# Patient Record
Sex: Male | Born: 1975 | Race: White | Hispanic: No | Marital: Married | Smoking: Never smoker
Health system: Southern US, Community
[De-identification: ages and names within clinical notes are randomized; demographics above are authoritative.]

## PROBLEM LIST (undated history)

## (undated) DIAGNOSIS — R480 Dyslexia and alexia: Secondary | ICD-10-CM

## (undated) DIAGNOSIS — Z87442 Personal history of urinary calculi: Secondary | ICD-10-CM

## (undated) DIAGNOSIS — I10 Essential (primary) hypertension: Secondary | ICD-10-CM

## (undated) HISTORY — DX: Essential (primary) hypertension: I10

## (undated) HISTORY — PX: KNEE SURGERY: SHX244

## (undated) HISTORY — PX: RETROPERITONEAL LYMPH NODE EXCISION: SUR551

## (undated) HISTORY — PX: APPENDECTOMY: SHX54

## (undated) HISTORY — PX: SHOULDER SURGERY: SHX246

---

## 2009-10-23 ENCOUNTER — Ambulatory Visit: Payer: Self-pay | Admitting: Cardiology

## 2009-10-23 DIAGNOSIS — I10 Essential (primary) hypertension: Secondary | ICD-10-CM

## 2009-10-23 DIAGNOSIS — R9431 Abnormal electrocardiogram [ECG] [EKG]: Secondary | ICD-10-CM | POA: Insufficient documentation

## 2009-10-24 ENCOUNTER — Encounter: Payer: Self-pay | Admitting: Cardiology

## 2009-10-24 ENCOUNTER — Ambulatory Visit: Payer: Self-pay | Admitting: Cardiology

## 2009-10-24 ENCOUNTER — Ambulatory Visit (HOSPITAL_COMMUNITY): Admission: RE | Admit: 2009-10-24 | Discharge: 2009-10-24 | Payer: Self-pay | Admitting: Cardiology

## 2009-10-25 ENCOUNTER — Encounter: Payer: Self-pay | Admitting: Cardiology

## 2009-10-27 ENCOUNTER — Telehealth (INDEPENDENT_AMBULATORY_CARE_PROVIDER_SITE_OTHER): Payer: Self-pay

## 2011-12-25 ENCOUNTER — Encounter: Payer: Self-pay | Admitting: Cardiology

## 2013-11-25 ENCOUNTER — Other Ambulatory Visit: Payer: Self-pay | Admitting: Nurse Practitioner

## 2013-11-29 NOTE — Telephone Encounter (Signed)
Last seen 11/09/12.

## 2013-12-31 ENCOUNTER — Other Ambulatory Visit: Payer: Self-pay | Admitting: Nurse Practitioner

## 2014-01-04 NOTE — Telephone Encounter (Signed)
ntbs

## 2014-01-04 NOTE — Telephone Encounter (Signed)
Last seen 11/11/3 MMM

## 2014-01-24 ENCOUNTER — Encounter: Payer: Self-pay | Admitting: Nurse Practitioner

## 2014-01-26 ENCOUNTER — Encounter: Payer: Self-pay | Admitting: Nurse Practitioner

## 2014-02-01 ENCOUNTER — Other Ambulatory Visit: Payer: Self-pay | Admitting: Nurse Practitioner

## 2014-04-19 ENCOUNTER — Other Ambulatory Visit: Payer: Self-pay

## 2014-05-18 ENCOUNTER — Encounter: Payer: Self-pay | Admitting: *Deleted

## 2014-05-25 ENCOUNTER — Encounter: Payer: Self-pay | Admitting: Physician Assistant

## 2014-05-26 ENCOUNTER — Telehealth: Payer: Self-pay | Admitting: Physician Assistant

## 2014-05-26 ENCOUNTER — Encounter: Payer: Self-pay | Admitting: Physician Assistant

## 2014-05-26 MED ORDER — HYDROCHLOROTHIAZIDE 25 MG PO TABS: ORAL_TABLET | ORAL | Status: DC

## 2014-05-26 NOTE — Telephone Encounter (Signed)
They had moved his appt with bill and [place]. Can i please refill hctz til we can get him in. If you will send back to me I will let patient know and get him scheduled

## 2014-05-26 NOTE — Telephone Encounter (Signed)
rx sent to pharmacy

## 2014-05-26 NOTE — Telephone Encounter (Signed)
Patient aware and appt scheduled.

## 2014-06-07 ENCOUNTER — Encounter: Payer: Self-pay | Admitting: Physician Assistant

## 2014-06-07 ENCOUNTER — Ambulatory Visit (INDEPENDENT_AMBULATORY_CARE_PROVIDER_SITE_OTHER): Payer: BC Managed Care – PPO | Admitting: Physician Assistant

## 2014-06-07 VITALS — BP 136/84 | HR 95 | Temp 98.3°F | Ht 74.0 in | Wt 396.4 lb

## 2014-06-07 DIAGNOSIS — Z0000 Encounter for general adult medical examination without abnormal findings: Secondary | ICD-10-CM

## 2014-06-07 MED ORDER — HYDROCHLOROTHIAZIDE 25 MG PO TABS: ORAL_TABLET | ORAL | Status: DC

## 2014-06-07 NOTE — Addendum Note (Signed)
Addended by: Inis Sizer on: 06/07/2014 06:25 PM   Modules accepted: Orders

## 2014-06-07 NOTE — Progress Notes (Signed)
Subjective:     Patient ID: Tyler Wheeler, male   DOB: 1976/12/03, 38 y.o.   MRN: [id number]  HPI Pt here today for PE/ wellness visit He does have the complaints of rash to both hands Rash only occurs when he comes into contact with a fluid used at work Rash is pruritic Also has swelling to both legs with some darker color to the legs  Review of Systems  Constitutional: Negative.   HENT: Negative.   Eyes: Negative.   Respiratory: Negative.   Cardiovascular: Positive for leg swelling.  Gastrointestinal: Negative.   Endocrine: Negative.   Genitourinary: Negative.   Musculoskeletal: Negative.   Skin: Positive for color change and rash.  Allergic/Immunologic: Negative.   Neurological: Negative.   Hematological: Negative.   Psychiatric/Behavioral: Negative.        Objective:   Physical Exam  Vitals reviewed. Constitutional: He is oriented to person, place, and time. He appears well-developed and well-nourished.  HENT:  Head: Normocephalic and atraumatic.  Right Ear: External ear normal.  Left Ear: External ear normal.  Nose: Nose normal.  Mouth/Throat: Oropharynx is clear and moist.  Eyes: Conjunctivae and EOM are normal. Pupils are equal, round, and reactive to light.  Neck: Normal range of motion. Neck supple. No JVD present. No thyromegaly present.  Cardiovascular: Normal rate, regular rhythm, normal heart sounds and intact distal pulses.   Pulmonary/Chest: Effort normal and breath sounds normal. He has no wheezes.  Abdominal: Soft. Bowel sounds are normal. He exhibits no distension and no mass. There is no tenderness. There is no guarding.  Musculoskeletal: Normal range of motion. He exhibits edema.  Stasis derm to both of the lower ext  Lymphadenopathy:    He has no cervical adenopathy.  Neurological: He is alert and oriented to person, place, and time. He has normal reflexes.  Skin: Skin is warm. Rash noted.  Well demarc rash to the dorsum of both hands No  ulceration Sl scale  Psychiatric: He has a normal mood and affect. His behavior is normal. Judgment and thought content normal.       Assessment:     CPE    Plan:     Pt has had recent dental visit Pt needing eye exam Discussed with the pt a need for exercise program and eating healthy Informed that his weight and having to stand for a prolonged time is causing his lower ext edema Recommended moving if he can, good shoes, and OTC compression stockings Avoidance of the chemical that is causing the rash OTC hydrocort and lotion to the areas RF of his med done today Order for fasting labs done F/U ending labs

## 2014-06-07 NOTE — Patient Instructions (Signed)
Fat and Cholesterol Control Diet  Fat and cholesterol levels in your blood and organs are influenced by your diet. High levels of fat and cholesterol may lead to diseases of the heart, small and large blood vessels, gallbladder, liver, and pancreas.  CONTROLLING FAT AND CHOLESTEROL WITH DIET  Although exercise and lifestyle factors are important, your diet is key. That is because certain foods are known to raise cholesterol and others to lower it. The goal is to balance foods for their effect on cholesterol and more importantly, to replace saturated and trans fat with other types of fat, such as monounsaturated fat, polyunsaturated fat, and omega-3 fatty acids.  On average, a person should consume no more than 15 to 17 g of saturated fat daily. Saturated and trans fats are considered "bad" fats, and they will raise LDL cholesterol. Saturated fats are primarily found in animal products such as meats, butter, and cream. However, that does not mean you need to give up all your favorite foods. Today, there are good tasting, low-fat, low-cholesterol substitutes for most of the things you like to eat. Choose low-fat or nonfat alternatives. Choose round or loin cuts of red meat. These types of cuts are lowest in fat and cholesterol. Chicken (without the skin), fish, veal, and ground turkey breast are great choices. Eliminate fatty meats, such as hot dogs and salami. Even shellfish have little or no saturated fat. Have a 3 oz (85 g) portion when you eat lean meat, poultry, or fish.  Trans fats are also called "partially hydrogenated oils." They are oils that have been scientifically manipulated so that they are solid at room temperature resulting in a longer shelf life and improved taste and texture of foods in which they are added. Trans fats are found in stick margarine, some tub margarines, cookies, crackers, and baked goods.   When baking and cooking, oils are a great substitute for butter. The monounsaturated oils are  especially beneficial since it is believed they lower LDL and raise HDL. The oils you should avoid entirely are saturated tropical oils, such as coconut and palm.   Remember to eat a lot from food groups that are naturally free of saturated and trans fat, including fish, fruit, vegetables, beans, grains (barley, rice, couscous, bulgur wheat), and pasta (without cream sauces).   IDENTIFYING FOODS THAT LOWER FAT AND CHOLESTEROL   Soluble fiber may lower your cholesterol. This type of fiber is found in fruits such as apples, vegetables such as broccoli, potatoes, and carrots, legumes such as beans, peas, and lentils, and grains such as barley. Foods fortified with plant sterols (phytosterol) may also lower cholesterol. You should eat at least 2 g per day of these foods for a cholesterol lowering effect.   Read package labels to identify low-saturated fats, trans fat free, and low-fat foods at the supermarket. Select cheeses that have only 2 to 3 g saturated fat per ounce. Use a heart-healthy tub margarine that is free of trans fats or partially hydrogenated oil. When buying baked goods (cookies, crackers), avoid partially hydrogenated oils. Breads and muffins should be made from whole grains (whole-wheat or whole oat flour, instead of "flour" or "enriched flour"). Buy non-creamy canned soups with reduced salt and no added fats.   FOOD PREPARATION TECHNIQUES   Never deep-fry. If you must fry, either stir-fry, which uses very little fat, or use non-stick cooking sprays. When possible, broil, bake, or roast meats, and steam vegetables. Instead of putting butter or margarine on vegetables, use lemon   and herbs, applesauce, and cinnamon (for squash and sweet potatoes). Use nonfat yogurt, salsa, and low-fat dressings for salads.   LOW-SATURATED FAT / LOW-FAT FOOD SUBSTITUTES  Meats / Saturated Fat (g)  · Avoid: Steak, marbled (3 oz/85 g) / 11 g  · Choose: Steak, lean (3 oz/85 g) / 4 g  · Avoid: Hamburger (3 oz/85 g) / 7  g  · Choose: Hamburger, lean (3 oz/85 g) / 5 g  · Avoid: Ham (3 oz/85 g) / 6 g  · Choose: Ham, lean cut (3 oz/85 g) / 2.4 g  · Avoid: Chicken, with skin, dark meat (3 oz/85 g) / 4 g  · Choose: Chicken, skin removed, dark meat (3 oz/85 g) / 2 g  · Avoid: Chicken, with skin, light meat (3 oz/85 g) / 2.5 g  · Choose: Chicken, skin removed, light meat (3 oz/85 g) / 1 g  Dairy / Saturated Fat (g)  · Avoid: Whole milk (1 cup) / 5 g  · Choose: Low-fat milk, 2% (1 cup) / 3 g  · Choose: Low-fat milk, 1% (1 cup) / 1.5 g  · Choose: Skim milk (1 cup) / 0.3 g  · Avoid: Hard cheese (1 oz/28 g) / 6 g  · Choose: Skim milk cheese (1 oz/28 g) / 2 to 3 g  · Avoid: Cottage cheese, 4% fat (1 cup) / 6.5 g  · Choose: Low-fat cottage cheese, 1% fat (1 cup) / 1.5 g  · Avoid: Ice cream (1 cup) / 9 g  · Choose: Sherbet (1 cup) / 2.5 g  · Choose: Nonfat frozen yogurt (1 cup) / 0.3 g  · Choose: Frozen fruit bar / trace  · Avoid: Whipped cream (1 tbs) / 3.5 g  · Choose: Nondairy whipped topping (1 tbs) / 1 g  Condiments / Saturated Fat (g)  · Avoid: Mayonnaise (1 tbs) / 2 g  · Choose: Low-fat mayonnaise (1 tbs) / 1 g  · Avoid: Butter (1 tbs) / 7 g  · Choose: Extra light margarine (1 tbs) / 1 g  · Avoid: Coconut oil (1 tbs) / 11.8 g  · Choose: Olive oil (1 tbs) / 1.8 g  · Choose: Corn oil (1 tbs) / 1.7 g  · Choose: Safflower oil (1 tbs) / 1.2 g  · Choose: Sunflower oil (1 tbs) / 1.4 g  · Choose: Soybean oil (1 tbs) / 2.4 g  · Choose: Canola oil (1 tbs) / 1 g  Document Released: 12/16/2005 Document Revised: 04/12/2013 Document Reviewed: 06/06/2011  [employer]® Patient Information ©2014 [employer].

## 2014-06-30 ENCOUNTER — Other Ambulatory Visit: Payer: Self-pay | Admitting: Nurse Practitioner

## 2014-12-19 ENCOUNTER — Other Ambulatory Visit: Payer: Self-pay | Admitting: Family Medicine

## 2015-01-14 ENCOUNTER — Other Ambulatory Visit: Payer: Self-pay | Admitting: Family Medicine

## 2015-04-02 ENCOUNTER — Other Ambulatory Visit: Payer: Self-pay | Admitting: Family Medicine

## 2015-06-11 ENCOUNTER — Other Ambulatory Visit: Payer: Self-pay | Admitting: Physician Assistant

## 2015-10-13 ENCOUNTER — Encounter: Payer: Self-pay | Admitting: Family Medicine

## 2015-10-13 ENCOUNTER — Ambulatory Visit (INDEPENDENT_AMBULATORY_CARE_PROVIDER_SITE_OTHER): Payer: BLUE CROSS/BLUE SHIELD | Admitting: Family Medicine

## 2015-10-13 VITALS — BP 155/101 | HR 110 | Temp 100.3°F | Ht 74.0 in | Wt >= 6400 oz

## 2015-10-13 DIAGNOSIS — L309 Dermatitis, unspecified: Secondary | ICD-10-CM | POA: Diagnosis not present

## 2015-10-13 DIAGNOSIS — I10 Essential (primary) hypertension: Secondary | ICD-10-CM | POA: Diagnosis not present

## 2015-10-13 DIAGNOSIS — J3489 Other specified disorders of nose and nasal sinuses: Secondary | ICD-10-CM

## 2015-10-13 MED ORDER — LISINOPRIL-HYDROCHLOROTHIAZIDE 20-25 MG PO TABS: 1.0000 | ORAL_TABLET | Freq: Every day | ORAL | Status: DC

## 2015-10-13 NOTE — Progress Notes (Signed)
BP 155/101 mmHg  Pulse 110  Temp(Src) 100.3 F (37.9 C)  Ht  (1.88 m)  Wt 410 lb 9.6 oz (186.247 kg)  BMI 52.70 kg/m2   Subjective:    Patient ID: Tyler Wheeler, male    DOB: Aug 09, 1976, 39 y.o.   MRN: [id number]  HPI: Tyler Wheeler is a 39 y.o. male presenting on 10/13/2015 for Hypertension; Dry, peeling hands; and Cough   HPI Hypertension Patient's blood pressure significantly elevated today at 170/103 and 150/100. He has been on hydrochlorothiazide 25 mg and taking it daily. Patient denies headaches, blurred vision, chest pains, shortness of breath, or weakness. Denies any side effects from medication and is content with current medication. He is quite large and his obesity is probably related to his blood pressure.  Eczema He gets really dry skin and eczema on his hands. He recently went to an urgent care for this and was given a topical steroid cream. Which has been helping greatly. 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 He said it has improved on the cream  Sinus congestion Patient has sinus congestion and nasal symptoms consistent with allergies. He does admit to getting allergies this time here. He says the worst of it is at night and early morning when he gets up. And he is currently using his Flonase early in the morning to help combat this. It is helping some but he still has a lot of congestion when he wakes up. He denies fevers or chills or sore throat but does have cough that is dry and nonproductive, it is mostly just the nasal congestion and sinus pressure. He was ill 1 month ago but got better from that and didn't know if that was related or not.  Relevant past medical, surgical, family and social history reviewed and updated as indicated. Interim medical history since our last visit reviewed. Allergies and medications reviewed and updated.  Review of  Systems  Constitutional: Negative for fever and chills.  HENT: Positive for congestion, postnasal drip, sinus pressure and sneezing. Negative for ear discharge, ear pain, rhinorrhea, sore throat and voice change.   Eyes: Negative for pain, discharge, redness and visual disturbance.  Respiratory: Positive for cough. Negative for shortness of breath and wheezing.   Cardiovascular: Negative for chest pain and leg swelling.  Gastrointestinal: Negative for abdominal pain, diarrhea and constipation.  Genitourinary: Negative for difficulty urinating.  Musculoskeletal: Negative for back pain and gait problem.  Skin: Positive for rash.  Neurological: Negative for syncope, light-headedness and headaches.  All other systems reviewed and are negative.   Per HPI unless specifically indicated above     Medication List       This list is accurate as of: 10/13/15  4:35 PM.  Always use your most recent med list.               lisinopril-hydrochlorothiazide 20-25 MG tablet  Commonly known as:  PRINZIDE,ZESTORETIC  Take 1 tablet by mouth daily.           Objective:    BP 170/103 mmHg  Pulse 110  Temp(Src) 100.3 F (37.9 C)  Ht  (1.88 m)  Wt 410 lb 9.6 oz (186.247 kg)  BMI 52.70 kg/m2  Wt Readings from Last 3 Encounters:  10/13/15 410 lb 9.6 oz (186.247 kg)  06/07/14 396 lb 6.4 oz (179.806 kg)  10/23/09 406 lb (184.16 kg)    Physical Exam  Constitutional: He is oriented to person, place, and time. He appears well-developed and well-nourished. No distress.  HENT:  Right Ear: Tympanic membrane, external ear and ear canal normal.  Left Ear: Tympanic membrane, external ear and ear canal normal.  Nose: Mucosal edema present. No rhinorrhea. No epistaxis. Right sinus exhibits maxillary sinus tenderness. Right sinus exhibits no frontal sinus tenderness. Left sinus exhibits maxillary sinus tenderness. Left sinus exhibits no frontal sinus tenderness.  Mouth/Throat: Uvula is midline and  mucous membranes are normal. Posterior oropharyngeal edema present. No oropharyngeal exudate, posterior oropharyngeal erythema or tonsillar abscesses.  Eyes: Conjunctivae and EOM are normal. Pupils are equal, round, and reactive to light. Right eye exhibits no discharge. No scleral icterus.  Cardiovascular: Normal rate, regular rhythm, normal heart sounds and intact distal pulses.   No murmur heard. Pulmonary/Chest: Effort normal and breath sounds normal. No respiratory distress. He has no wheezes.  Abdominal: He exhibits no distension.  Musculoskeletal: Normal range of motion. He exhibits no edema.  Neurological: He is alert and oriented to person, place, and time. Coordination normal.  Skin: Skin is warm and dry. Rash (excoriation and cracking of the hands, very dry appearance.) noted. He is not diaphoretic.  Psychiatric: He has a normal mood and affect. His behavior is normal.  Vitals reviewed.   No results found for this or any previous visit.    Assessment & Plan:       Problem List Items Addressed This Visit      Cardiovascular and Mediastinum   Essential hypertension - Primary    Will get labs at work and he will send to me      Relevant Medications   lisinopril-hydrochlorothiazide (PRINZIDE,ZESTORETIC) 20-25 MG tablet     Musculoskeletal and Integument   Eczema    Has steroid cream, will do wet to dry gloves and no hot showers.       Other Visit Diagnoses    Sinus pressure        has flonase, take before bed        Follow up plan: Return in about 4 weeks (around 11/10/2015), or if symptoms worsen or fail to improve, for htn recheck.  Arville CareJoshua Dettinger, MD [facility] 10/13/2015, 4:35 PM

## 2015-10-13 NOTE — Assessment & Plan Note (Signed)
Will get labs at work and he will send to me

## 2015-10-13 NOTE — Assessment & Plan Note (Signed)
Has steroid cream, will do wet to dry gloves and no hot showers.

## 2015-11-17 ENCOUNTER — Ambulatory Visit: Payer: Self-pay | Admitting: Family Medicine

## 2015-11-20 ENCOUNTER — Encounter: Payer: Self-pay | Admitting: Family Medicine

## 2016-09-26 ENCOUNTER — Ambulatory Visit (INDEPENDENT_AMBULATORY_CARE_PROVIDER_SITE_OTHER): Payer: Worker's Compensation | Admitting: Family Medicine

## 2016-09-26 ENCOUNTER — Encounter: Payer: Self-pay | Admitting: Family Medicine

## 2016-09-26 VITALS — BP 110/75 | HR 89 | Temp 97.4°F | Ht 74.0 in | Wt >= 6400 oz

## 2016-09-26 DIAGNOSIS — S8391XA Sprain of unspecified site of right knee, initial encounter: Secondary | ICD-10-CM | POA: Diagnosis not present
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
## 2016-09-26 NOTE — Patient Instructions (Signed)
Great to meet you!  Today try to elevate your knee, apply ice for 15 minutes 4 or 5 times. Take 2 Aleve twice daily until we follow-up next Monday  Call or come back with any concerns

## 2016-09-30 ENCOUNTER — Ambulatory Visit (INDEPENDENT_AMBULATORY_CARE_PROVIDER_SITE_OTHER): Payer: Worker's Compensation | Admitting: Family Medicine

## 2016-09-30 ENCOUNTER — Encounter: Payer: Self-pay | Admitting: Family Medicine

## 2016-09-30 VITALS — BP 124/85 | HR 83 | Temp 97.2°F | Ht 74.0 in | Wt >= 6400 oz

## 2016-09-30 DIAGNOSIS — S8391XD Sprain of unspecified site of right knee, subsequent encounter: Secondary | ICD-10-CM | POA: Diagnosis not present

## 2016-09-30 DIAGNOSIS — S8991XD Unspecified injury of right lower leg, subsequent encounter: Secondary | ICD-10-CM

## 2016-09-30 NOTE — Patient Instructions (Signed)
Great to see you!  You will be called for an appointment to orthopedics
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
## 2016-10-01 ENCOUNTER — Other Ambulatory Visit: Payer: Self-pay | Admitting: Family Medicine

## 2016-10-01 DIAGNOSIS — I10 Essential (primary) hypertension: Secondary | ICD-10-CM

## 2016-12-13 ENCOUNTER — Other Ambulatory Visit: Payer: Self-pay | Admitting: Family Medicine

## 2016-12-13 DIAGNOSIS — I10 Essential (primary) hypertension: Secondary | ICD-10-CM

## 2017-03-28 ENCOUNTER — Other Ambulatory Visit: Payer: Self-pay | Admitting: Family Medicine

## 2017-03-28 DIAGNOSIS — I10 Essential (primary) hypertension: Secondary | ICD-10-CM

## 2017-09-04 ENCOUNTER — Encounter: Payer: Self-pay | Admitting: Family Medicine

## 2017-09-04 ENCOUNTER — Ambulatory Visit (INDEPENDENT_AMBULATORY_CARE_PROVIDER_SITE_OTHER): Payer: BLUE CROSS/BLUE SHIELD | Admitting: Family Medicine

## 2017-09-04 VITALS — BP 106/59 | HR 51 | Temp 98.6°F | Ht 74.0 in | Wt 388.0 lb

## 2017-09-04 DIAGNOSIS — Z0000 Encounter for general adult medical examination without abnormal findings: Secondary | ICD-10-CM | POA: Diagnosis not present

## 2017-09-04 DIAGNOSIS — M722 Plantar fascial fibromatosis: Secondary | ICD-10-CM

## 2017-09-04 DIAGNOSIS — I10 Essential (primary) hypertension: Secondary | ICD-10-CM

## 2017-09-04 DIAGNOSIS — Z13220 Encounter for screening for lipoid disorders: Secondary | ICD-10-CM

## 2017-09-04 DIAGNOSIS — K625 Hemorrhage of anus and rectum: Secondary | ICD-10-CM

## 2017-09-04 DIAGNOSIS — R5383 Other fatigue: Secondary | ICD-10-CM

## 2017-09-04 MED ORDER — LISINOPRIL-HYDROCHLOROTHIAZIDE 20-25 MG PO TABS: 1.0000 | ORAL_TABLET | Freq: Every day | ORAL | 1 refills | Status: DC

## 2017-09-04 NOTE — Progress Notes (Addendum)
BP (!) 106/59   Pulse (!) 51   Temp 98.6 F (37 C) (Oral)   Ht 6' 2"  (1.88 m)   Wt (!) 388 lb (176 kg)   BMI 49.82 kg/m    Subjective:    Patient ID: Tyler Wheeler, male    DOB: November 11, 1976, 41 y.o.   MRN: [id number]  HPI: Tyler Wheeler is a 41 y.o. male presenting on 09/04/2017 for his annual well adult exam. He is followed for hypertension. His blood pressure is running a little low today, and he takes 25 mg lisinopril/hctz without complaints.  He is feeling well today but does have a few medical complaints. He has been having pain in the sole of his right foot that radiates into his heel after prolonged standing on concrete floors. The pain is sharp. He has taken aleve without relief. It improves with rest.   He additionally reports BRP per rectum that leaves streaks in his underwear 1-2 times per week while sitting at home for the past year. He reports occasional spotting on the TP after defecation, but denies pain with defecation, melena, hematochezia. He additionally reports occasional light headedness and tunneled vision at work, particularly after standing from a seated position. His legs are swollen after standing all day at work. He also reports increased fatigue and daytime sleepiness. He sleeps 4-6 hours per night and his wife reports that he snores but does not stop breathing. He occasionally wakes up drenched in sweat. His grandfather has a history of hemorrhoids and his father has a history of sleep apnea.    HPI Relevant past medical, surgical, family and social history reviewed and updated as indicated. Interim medical history since our last visit reviewed. Allergies and medications reviewed and updated.  Review of Systems  Constitutional: Positive for fatigue. Negative for appetite change, chills, fever and unexpected weight change.  HENT: Negative for congestion, postnasal drip, rhinorrhea, sinus pain, sinus pressure and sore throat.   Respiratory: Negative for cough,  choking, shortness of breath and wheezing.   Cardiovascular: Negative for chest pain, palpitations and leg swelling.  Gastrointestinal: Positive for anal bleeding. Negative for abdominal pain, blood in stool, constipation, diarrhea, nausea, rectal pain and vomiting.  Genitourinary: Negative for difficulty urinating, dysuria, frequency, hematuria and urgency.  Musculoskeletal: Positive for arthralgias (pain in sole of right foot). Negative for back pain, joint swelling and myalgias.  Skin: Negative for color change, pallor, rash and wound.  Neurological: Positive for light-headedness. Negative for dizziness, weakness, numbness and headaches.  Psychiatric/Behavioral: Negative for agitation, behavioral problems and confusion.    Per HPI unless specifically indicated above     Objective:    BP (!) 106/59   Pulse (!) 51   Temp 98.6 F (37 C) (Oral)   Ht 6' 2"  (1.88 m)   Wt (!) 388 lb (176 kg)   BMI 49.82 kg/m   Wt Readings from Last 3 Encounters:  09/04/17 (!) 388 lb (176 kg)  09/30/16 (!) 410 lb 3.2 oz (186.1 kg)  09/26/16 (!) 400 lb 6.4 oz (181.6 kg)    Physical Exam  Constitutional: He is oriented to person, place, and time. He appears well-developed and well-nourished. No distress.  HENT:  Head: Normocephalic and atraumatic.  Nose: Nose normal.  Mouth/Throat: Oropharynx is clear and moist. No oropharyngeal exudate.  Mallampati I  Eyes: Pupils are equal, round, and reactive to light. Conjunctivae and EOM are normal. No scleral icterus.  Neck: Normal range of motion. Neck supple. No tracheal deviation  present. No thyromegaly present.  Cardiovascular: Normal rate, regular rhythm and normal heart sounds.   No murmur heard. Pulmonary/Chest: Effort normal and breath sounds normal. No respiratory distress. He has no wheezes. He has no rales.  Abdominal: Soft. Bowel sounds are normal. He exhibits no distension and no mass. There is no tenderness. There is no rebound and no guarding.    Genitourinary: Rectum normal and prostate normal. Rectal exam shows no external hemorrhoid, no internal hemorrhoid (not palpated, likely present), no fissure, no mass and anal tone normal.  Musculoskeletal: Normal range of motion. He exhibits edema (bilateral 2+ pitting edema in lower extremities ) and tenderness (TTP on right plantar fascia).  Lymphadenopathy:    He has no cervical adenopathy.  Neurological: He is alert and oriented to person, place, and time. He has normal reflexes.  Skin: Skin is warm and dry. No rash noted. He is not diaphoretic. No erythema.  Psychiatric: He has a normal mood and affect. His behavior is normal. Judgment and thought content normal.  Nursing note and vitals reviewed.     Assessment & Plan:   Problem List Items Addressed This Visit      Cardiovascular and Mediastinum   Essential hypertension   Relevant Medications   lisinopril-hydrochlorothiazide (PRINZIDE,ZESTORETIC) 20-25 MG tablet   Other Relevant Orders   CMP14+EGFR    Other Visit Diagnoses    Well adult exam    -  Primary   Plantar fasciitis       recommended ice and stretching   Bright red rectal bleeding       Relevant Orders   CBC with Differential/Platelet   Other fatigue       Relevant Orders   CBC with Differential/Platelet   TSH   Lipid screening       Relevant Orders   Lipid panel      Tyler Wheeler is a 41 y.o. male presenting on 09/04/2017 for his annual well adult exam.   Hypertension: His blood pressure was 106/59 today, and he takes 25 mg lisinopril/hctz. He does occasionally become light headed at work, particularly after standing. He has had some leg swelling so I will not discontinue the hctz component and instead reduce his dose by 50%.    Right Foot pain: He has TTP over his plantar fascia. This is plantar fasciitis. I counseled him to purchase arch support insoles for his shoes and continue to use Aleve. I also counseled him not to walk barefoot on his hard floors at  home, and instructed him to research plantar exercises to do daily.   Rectal bleeding: His rectal bleeding leaves streaks on his underwear and spots on the toilet paper. In an obese patient his age this is most likely hemorrhoids, he also has a history of hemmhoroids in his grandfather. I did visualize external or not palpate internal hemihidrosis on exam, but external hemorrhoids are still #1 on my differential given the painless bleeding. I instructed him to use a suppository with hemmhoroid cream at home, and to return if he has no improvement or begins having grossly bloody stools or pain with defecation.   Fatigue: He reports a history of daytime sleepiness and napping. He only sleeps 4-6 hours per night and has to wake up at 2-3 am for work, so this may well be chronic fatigue 2/2 poor sleep schedule. His wife reports snoring, but no waking up gasping for air. He does not wake up with headaches. His Mallampati score is I, but his  father has a history of sleep apnea and he is over weight. I counseled him to talk about a sleep study with his wife and we will discuss it at his next visit in 6 months. I will also check a CBC and TSH.      Follow up plan: Return if symptoms worsen or fail to improve.  Counseling provided for all of the vaccine components Orders Placed This Encounter  Procedures  . CBC with Differential/Platelet  . TSH  . CMP14+EGFR  . Lipid panel   Patient was seen and examined with Brock Bad medical student. Agree with assessment and plan above. We'll await labs and then see about possible sleep study in the future. Caryl Pina, MD [facility] 09/04/2017, 2:50 PM

## 2017-09-05 LAB — CMP14+EGFR
ALT: 20 IU/L (ref 0–44)
AST: 22 IU/L (ref 0–40)
Albumin/Globulin Ratio: 1.4 (ref 1.2–2.2)
Albumin: 4.3 g/dL (ref 3.5–5.5)
Alkaline Phosphatase: 63 IU/L (ref 39–117)
BUN/Creatinine Ratio: 12 (ref 9–20)
BUN: 11 mg/dL (ref 6–24)
Bilirubin Total: 0.3 mg/dL (ref 0.0–1.2)
CALCIUM: 8.9 mg/dL (ref 8.7–10.2)
CO2: 25 mmol/L (ref 20–29)
CREATININE: 0.95 mg/dL (ref 0.76–1.27)
Chloride: 99 mmol/L (ref 96–106)
GFR calc Af Amer: 115 mL/min/{1.73_m2} (ref >59)
GFR, EST NON AFRICAN AMERICAN: 100 mL/min/{1.73_m2} (ref >59)
GLUCOSE: 92 mg/dL (ref 65–99)
Globulin, Total: 3 g/dL (ref 1.5–4.5)
Potassium: 3.9 mmol/L (ref 3.5–5.2)
Sodium: 138 mmol/L (ref 134–144)
Total Protein: 7.3 g/dL (ref 6.0–8.5)

## 2017-09-05 LAB — CBC WITH DIFFERENTIAL/PLATELET
BASOS ABS: 0 10*3/uL (ref 0.0–0.2)
BASOS: 0 %
EOS (ABSOLUTE): 0.1 10*3/uL (ref 0.0–0.4)
Eos: 1 %
Hematocrit: 37.6 % (ref 37.5–51.0)
Hemoglobin: 12.1 g/dL — ABNORMAL LOW (ref 13.0–17.7)
IMMATURE GRANS (ABS): 0 10*3/uL (ref 0.0–0.1)
IMMATURE GRANULOCYTES: 0 %
LYMPHS: 27 %
Lymphocytes Absolute: 2.5 10*3/uL (ref 0.7–3.1)
MCH: 28.2 pg (ref 26.6–33.0)
MCHC: 32.2 g/dL (ref 31.5–35.7)
MCV: 88 fL (ref 79–97)
Monocytes Absolute: 0.6 10*3/uL (ref 0.1–0.9)
Monocytes: 6 %
NEUTROS PCT: 66 %
Neutrophils Absolute: 6.1 10*3/uL (ref 1.4–7.0)
PLATELETS: 253 10*3/uL (ref 150–379)
RBC: 4.29 x10E6/uL (ref 4.14–5.80)
RDW: 14.7 % (ref 12.3–15.4)
WBC: 9.3 10*3/uL (ref 3.4–10.8)

## 2017-09-05 LAB — LIPID PANEL
CHOL/HDL RATIO: 3.6 ratio (ref 0.0–5.0)
CHOLESTEROL TOTAL: 127 mg/dL (ref 100–199)
HDL: 35 mg/dL — AB (ref >39)
LDL CALC: 77 mg/dL (ref 0–99)
Triglycerides: 77 mg/dL (ref 0–149)
VLDL Cholesterol Cal: 15 mg/dL (ref 5–40)

## 2017-09-05 LAB — TSH: TSH: 1.25 u[IU]/mL (ref 0.450–4.500)

## 2017-09-07 ENCOUNTER — Other Ambulatory Visit: Payer: Self-pay | Admitting: Family Medicine

## 2017-09-07 DIAGNOSIS — I10 Essential (primary) hypertension: Secondary | ICD-10-CM

## 2017-09-08 ENCOUNTER — Other Ambulatory Visit: Payer: Self-pay | Admitting: Family Medicine

## 2017-09-08 DIAGNOSIS — I10 Essential (primary) hypertension: Secondary | ICD-10-CM

## 2018-03-18 ENCOUNTER — Other Ambulatory Visit: Payer: Self-pay | Admitting: Family Medicine

## 2018-03-18 DIAGNOSIS — I10 Essential (primary) hypertension: Secondary | ICD-10-CM

## 2018-03-18 NOTE — Telephone Encounter (Signed)
Last seen 09/04/17  Dr D

## 2018-06-23 ENCOUNTER — Other Ambulatory Visit: Payer: Self-pay | Admitting: Family Medicine

## 2018-06-23 DIAGNOSIS — I10 Essential (primary) hypertension: Secondary | ICD-10-CM

## 2018-06-23 NOTE — Telephone Encounter (Signed)
Last seen 09/04/17

## 2018-07-21 ENCOUNTER — Other Ambulatory Visit: Payer: Self-pay | Admitting: Family Medicine

## 2018-07-21 DIAGNOSIS — I10 Essential (primary) hypertension: Secondary | ICD-10-CM

## 2019-12-17 ENCOUNTER — Encounter: Payer: BLUE CROSS/BLUE SHIELD | Admitting: Family Medicine

## 2020-02-10 ENCOUNTER — Other Ambulatory Visit: Payer: Self-pay

## 2020-02-11 ENCOUNTER — Other Ambulatory Visit: Payer: Self-pay

## 2020-02-11 ENCOUNTER — Ambulatory Visit (INDEPENDENT_AMBULATORY_CARE_PROVIDER_SITE_OTHER): Payer: BLUE CROSS/BLUE SHIELD | Admitting: Family Medicine

## 2020-02-11 ENCOUNTER — Encounter: Payer: Self-pay | Admitting: Family Medicine

## 2020-02-11 VITALS — BP 135/86 | HR 85 | Temp 99.5°F | Ht 74.0 in | Wt >= 6400 oz

## 2020-02-11 DIAGNOSIS — I10 Essential (primary) hypertension: Secondary | ICD-10-CM

## 2020-02-11 DIAGNOSIS — Z0000 Encounter for general adult medical examination without abnormal findings: Secondary | ICD-10-CM | POA: Diagnosis not present

## 2020-02-11 DIAGNOSIS — H109 Unspecified conjunctivitis: Secondary | ICD-10-CM | POA: Insufficient documentation

## 2020-02-11 DIAGNOSIS — J019 Acute sinusitis, unspecified: Secondary | ICD-10-CM | POA: Insufficient documentation

## 2020-02-11 DIAGNOSIS — L299 Pruritus, unspecified: Secondary | ICD-10-CM | POA: Insufficient documentation

## 2020-02-11 DIAGNOSIS — J189 Pneumonia, unspecified organism: Secondary | ICD-10-CM | POA: Insufficient documentation

## 2020-02-11 MED ORDER — LISINOPRIL-HYDROCHLOROTHIAZIDE 20-25 MG PO TABS: 0.5000 | ORAL_TABLET | Freq: Every day | ORAL | 3 refills | Status: DC

## 2020-02-11 NOTE — Progress Notes (Signed)
BP 135/86   Pulse 85   Temp 99.5 F (37.5 C) (Temporal)   Ht 6' 2"  (1.88 m)   Wt (!) 404 lb 12.8 oz (183.6 kg)   SpO2 99%   BMI 51.97 kg/m    Subjective:   Patient ID: Tyler Wheeler, male    DOB: [birth year], 44 y.o.   MRN: [id number]  HPI: Taytum Wheller is a 44 y.o. male presenting on 02/11/2020 for Annual Exam and Leg Swelling (bilateral- states it has been on and off)   HPI Adult well exam Patient comes in for adult well exam and physical, he says he can get some leg swelling on and off especially when he is on his feet a lot for work.  He was taking the hydrochlorothiazide but got worse when he ran out of his medication.  The swelling is not severe but it does come a little bit off and on.  Patient denies any other health issues or concerns. Patient denies any chest pain, shortness of breath, headaches or vision issues, abdominal complaints, diarrhea, nausea, vomiting, or joint issues.   Hypertension Patient is currently on lisinopril hydrochlorothiazide, and their blood pressure today is 135/86. Patient denies any lightheadedness or dizziness. Patient denies headaches, blurred vision, chest pains, shortness of breath, or weakness. Denies any side effects from medication and is content with current medication.   Relevant past medical, surgical, family and social history reviewed and updated as indicated. Interim medical history since our last visit reviewed. Allergies and medications reviewed and updated.  Review of Systems  Constitutional: Negative for chills and fever.  HENT: Negative for ear pain and tinnitus.   Eyes: Negative for pain and discharge.  Respiratory: Negative for cough, shortness of breath and wheezing.   Cardiovascular: Negative for chest pain, palpitations and leg swelling (Trace bilateral lower extremity).  Gastrointestinal: Negative for abdominal pain, blood in stool, constipation and diarrhea.  Genitourinary: Negative for dysuria and hematuria.    Musculoskeletal: Negative for back pain, gait problem and myalgias.  Skin: Negative for rash.  Neurological: Negative for dizziness, weakness and headaches.  Psychiatric/Behavioral: Negative for suicidal ideas.  All other systems reviewed and are negative.   Per HPI unless specifically indicated above   Allergies as of 02/11/2020      Reactions   No Known Allergies       Medication List       Accurate as of February 11, 2020  3:36 PM. If you have any questions, ask your nurse or doctor.        lisinopril-hydrochlorothiazide 20-25 MG tablet Commonly known as: ZESTORETIC Take 0.5 tablets by mouth daily. What changed: how much to take Changed by: Fransisca Kaufmann Darrio Bade, MD        Objective:   BP 135/86   Pulse 85   Temp 99.5 F (37.5 C) (Temporal)   Ht 6' 2"  (1.88 m)   Wt (!) 404 lb 12.8 oz (183.6 kg)   SpO2 99%   BMI 51.97 kg/m   Wt Readings from Last 3 Encounters:  02/11/20 (!) 404 lb 12.8 oz (183.6 kg)  09/04/17 (!) 388 lb (176 kg)  09/30/16 (!) 410 lb 3.2 oz (186.1 kg)    Physical Exam Vitals and nursing note reviewed.  Constitutional:      General: He is not in acute distress.    Appearance: He is well-developed. He is not diaphoretic.  HENT:     Right Ear: External ear normal.     Left Ear: External ear  normal.     Nose: Nose normal.     Mouth/Throat:     Pharynx: No oropharyngeal exudate.  Eyes:     General: No scleral icterus.       Right eye: No discharge.     Conjunctiva/sclera: Conjunctivae normal.     Pupils: Pupils are equal, round, and reactive to light.  Neck:     Thyroid: No thyromegaly.  Cardiovascular:     Rate and Rhythm: Normal rate and regular rhythm.     Heart sounds: Normal heart sounds. No murmur.  Pulmonary:     Effort: Pulmonary effort is normal. No respiratory distress.     Breath sounds: Normal breath sounds. No wheezing.  Abdominal:     General: Bowel sounds are normal. There is no distension.     Palpations: Abdomen is  soft.     Tenderness: There is no abdominal tenderness. There is no guarding or rebound.  Musculoskeletal:        General: Normal range of motion.     Cervical back: Neck supple.  Lymphadenopathy:     Cervical: No cervical adenopathy.  Skin:    General: Skin is warm and dry.     Findings: No rash.  Neurological:     Mental Status: He is alert and oriented to person, place, and time.     Coordination: Coordination normal.  Psychiatric:        Behavior: Behavior normal.       Assessment & Plan:   Problem List Items Addressed This Visit      Cardiovascular and Mediastinum   Essential hypertension   Relevant Medications   lisinopril-hydrochlorothiazide (ZESTORETIC) 20-25 MG tablet   Other Relevant Orders   CBC with Differential/Platelet (Completed)   CMP14+EGFR (Completed)   Lipid panel (Completed)     Other   MORBID OBESITY   Relevant Orders   TSH (Completed)    Other Visit Diagnoses    Well adult exam    -  Primary   Relevant Orders   CBC with Differential/Platelet (Completed)   CMP14+EGFR (Completed)   Lipid panel (Completed)   TSH (Completed)      Continue lisinopril hydrochlorothiazide at half dose No other change medication Follow up plan: Return in about 1 year (around 02/10/2021), or if symptoms worsen or fail to improve, for Well examine hypertension.  Counseling provided for all of the vaccine components Orders Placed This Encounter  Procedures  . CBC with Differential/Platelet  . CMP14+EGFR  . Lipid panel  . TSH    Caryl Pina, MD [facility] 02/11/2020, 3:36 PM

## 2020-02-12 LAB — CMP14+EGFR
ALT: 21 IU/L (ref 0–44)
AST: 21 IU/L (ref 0–40)
Albumin/Globulin Ratio: 1.2 (ref 1.2–2.2)
Albumin: 4.1 g/dL (ref 4.0–5.0)
Alkaline Phosphatase: 69 IU/L (ref 39–117)
BUN/Creatinine Ratio: 11 (ref 9–20)
BUN: 10 mg/dL (ref 6–24)
Bilirubin Total: 0.2 mg/dL (ref 0.0–1.2)
CO2: 25 mmol/L (ref 20–29)
Calcium: 8.9 mg/dL (ref 8.7–10.2)
Chloride: 103 mmol/L (ref 96–106)
Creatinine, Ser: 0.91 mg/dL (ref 0.76–1.27)
GFR calc Af Amer: 120 mL/min/{1.73_m2} (ref >59)
GFR calc non Af Amer: 104 mL/min/{1.73_m2} (ref >59)
Globulin, Total: 3.3 g/dL (ref 1.5–4.5)
Glucose: 94 mg/dL (ref 65–99)
Potassium: 4 mmol/L (ref 3.5–5.2)
Sodium: 139 mmol/L (ref 134–144)
Total Protein: 7.4 g/dL (ref 6.0–8.5)

## 2020-02-12 LAB — CBC WITH DIFFERENTIAL/PLATELET
Basophils Absolute: 0.1 10*3/uL (ref 0.0–0.2)
Basos: 1 %
EOS (ABSOLUTE): 0.2 10*3/uL (ref 0.0–0.4)
Eos: 2 %
Hematocrit: 37.5 % (ref 37.5–51.0)
Hemoglobin: 12.4 g/dL — ABNORMAL LOW (ref 13.0–17.7)
Immature Grans (Abs): 0 10*3/uL (ref 0.0–0.1)
Immature Granulocytes: 0 %
Lymphocytes Absolute: 2.5 10*3/uL (ref 0.7–3.1)
Lymphs: 30 %
MCH: 27.5 pg (ref 26.6–33.0)
MCHC: 33.1 g/dL (ref 31.5–35.7)
MCV: 83 fL (ref 79–97)
Monocytes Absolute: 0.5 10*3/uL (ref 0.1–0.9)
Monocytes: 7 %
Neutrophils Absolute: 5 10*3/uL (ref 1.4–7.0)
Neutrophils: 60 %
Platelets: 239 10*3/uL (ref 150–450)
RBC: 4.51 x10E6/uL (ref 4.14–5.80)
RDW: 13.1 % (ref 11.6–15.4)
WBC: 8.3 10*3/uL (ref 3.4–10.8)

## 2020-02-12 LAB — LIPID PANEL
Chol/HDL Ratio: 3.2 ratio (ref 0.0–5.0)
Cholesterol, Total: 124 mg/dL (ref 100–199)
HDL: 39 mg/dL — ABNORMAL LOW (ref >39)
LDL Chol Calc (NIH): 72 mg/dL (ref 0–99)
Triglycerides: 63 mg/dL (ref 0–149)
VLDL Cholesterol Cal: 13 mg/dL (ref 5–40)

## 2020-02-12 LAB — TSH: TSH: 1.5 u[IU]/mL (ref 0.450–4.500)

## 2020-02-14 ENCOUNTER — Telehealth: Payer: Self-pay | Admitting: Family Medicine

## 2020-02-14 NOTE — Telephone Encounter (Signed)
Noted that patient will call back with fax number.

## 2020-02-14 NOTE — Telephone Encounter (Signed)
Pt returned missed call from [place]. Went over lab results with patient per Dr Oris Drone note. Pt said when he goes to work tomorrow, he will the get the fax# and call us back with it.

## 2020-02-21 NOTE — Telephone Encounter (Signed)
Pt called to give Dr Dettinger the Fax# to send his Physical form to. Fax# is [phone].

## 2020-02-21 NOTE — Telephone Encounter (Signed)
Paper faxed.

## 2020-02-21 NOTE — Telephone Encounter (Signed)
Here is the fax number for that physical form that he gave [place]

## 2021-02-12 ENCOUNTER — Other Ambulatory Visit: Payer: Self-pay

## 2021-02-12 ENCOUNTER — Encounter: Payer: Self-pay | Admitting: Family Medicine

## 2021-02-12 ENCOUNTER — Ambulatory Visit (INDEPENDENT_AMBULATORY_CARE_PROVIDER_SITE_OTHER): Payer: BC Managed Care – PPO | Admitting: Family Medicine

## 2021-02-12 VITALS — BP 128/83 | HR 109 | Ht 74.0 in | Wt >= 6400 oz

## 2021-02-12 DIAGNOSIS — I10 Essential (primary) hypertension: Secondary | ICD-10-CM

## 2021-02-12 DIAGNOSIS — Z0001 Encounter for general adult medical examination with abnormal findings: Secondary | ICD-10-CM | POA: Diagnosis not present

## 2021-02-12 DIAGNOSIS — E8881 Metabolic syndrome: Secondary | ICD-10-CM

## 2021-02-12 DIAGNOSIS — Z0000 Encounter for general adult medical examination without abnormal findings: Secondary | ICD-10-CM

## 2021-02-12 DIAGNOSIS — E669 Obesity, unspecified: Secondary | ICD-10-CM

## 2021-02-12 MED ORDER — LISINOPRIL-HYDROCHLOROTHIAZIDE 20-25 MG PO TABS: 0.5000 | ORAL_TABLET | Freq: Every day | ORAL | 3 refills | Status: DC

## 2021-02-12 NOTE — Progress Notes (Signed)
BP 128/83   Pulse (!) 109   Ht 6' 2"  (1.88 m)   Wt (!) 418 lb (189.6 kg)   SpO2 98%   BMI 53.67 kg/m    Subjective:   Patient ID: Tyler Wheeler, male    DOB: 24-Jan-1976, 45 y.o.   MRN: [id number]  HPI: Tyler Wheeler is a 45 y.o. male presenting on 02/12/2021 for Medical Management of Chronic Issues (CPE)   HPI Well adult exam and recheck of chronic medical issues Patient denies any chest pain, shortness of breath, headaches or vision issues, abdominal complaints, diarrhea, nausea, vomiting, or joint issues.  Hypertension Patient is currently on lisinopril hydrochlorothiazide, and their blood pressure today is 128/83. Patient denies any lightheadedness or dizziness. Patient denies headaches, blurred vision, chest pains, shortness of breath, or weakness. Denies any side effects from medication and is content with current medication.   Patient's hypertension are more complicated by the patient's morbid obesity.  Discussed weight loss and lifestyle modification and exercise with the patient.  Patient has steadily gained weight over the past few years and is now up to a BMI of 53.  Relevant past medical, surgical, family and social history reviewed and updated as indicated. Interim medical history since our last visit reviewed. Allergies and medications reviewed and updated.  Review of Systems  Constitutional: Negative for chills and fever.  HENT: Negative for ear pain and tinnitus.   Eyes: Negative for pain.  Respiratory: Negative for cough, shortness of breath and wheezing.   Cardiovascular: Negative for chest pain, palpitations and leg swelling.  Gastrointestinal: Negative for abdominal pain, blood in stool, constipation and diarrhea.  Genitourinary: Negative for dysuria and hematuria.  Musculoskeletal: Negative for back pain and myalgias.  Skin: Negative for rash.  Neurological: Negative for dizziness, weakness and headaches.  Psychiatric/Behavioral: Negative for suicidal ideas.     Per HPI unless specifically indicated above   Allergies as of 02/12/2021      Reactions   No Known Allergies       Medication List       Accurate as of February 12, 2021  3:08 PM. If you have any questions, ask your nurse or doctor.        lisinopril-hydrochlorothiazide 20-25 MG tablet Commonly known as: ZESTORETIC Take 0.5 tablets by mouth daily.        Objective:   BP 128/83   Pulse (!) 109   Ht 6' 2"  (1.88 m)   Wt (!) 418 lb (189.6 kg)   SpO2 98%   BMI 53.67 kg/m   Wt Readings from Last 3 Encounters:  02/12/21 (!) 418 lb (189.6 kg)  02/11/20 (!) 404 lb 12.8 oz (183.6 kg)  09/04/17 (!) 388 lb (176 kg)    Physical Exam Vitals and nursing note reviewed.  Constitutional:      General: He is not in acute distress.    Appearance: He is well-developed and well-nourished. He is not diaphoretic.  HENT:     Right Ear: External ear normal.     Left Ear: External ear normal.     Nose: Nose normal.     Mouth/Throat:     Mouth: Oropharynx is clear and moist.     Pharynx: No oropharyngeal exudate.  Eyes:     General: No scleral icterus.    Extraocular Movements: EOM normal.     Conjunctiva/sclera: Conjunctivae normal.  Neck:     Thyroid: No thyromegaly.  Cardiovascular:     Rate and Rhythm: Normal rate and regular  rhythm.     Pulses: Intact distal pulses.     Heart sounds: Normal heart sounds. No murmur heard.   Pulmonary:     Effort: Pulmonary effort is normal. No respiratory distress.     Breath sounds: Normal breath sounds. No wheezing.  Abdominal:     General: Bowel sounds are normal. There is no distension.     Palpations: Abdomen is soft.     Tenderness: There is no abdominal tenderness. There is no guarding or rebound.  Musculoskeletal:        General: No edema. Normal range of motion.     Cervical back: Neck supple.  Lymphadenopathy:     Cervical: No cervical adenopathy.  Skin:    General: Skin is warm and dry.     Findings: No rash.   Neurological:     Mental Status: He is alert and oriented to person, place, and time.     Coordination: Coordination normal.  Psychiatric:        Mood and Affect: Mood and affect normal.        Behavior: Behavior normal.       Assessment & Plan:   Problem List Items Addressed This Visit      Cardiovascular and Mediastinum   Essential hypertension   Relevant Medications   lisinopril-hydrochlorothiazide (ZESTORETIC) 20-25 MG tablet   Other Relevant Orders   CMP14+EGFR     Other   MORBID OBESITY   Relevant Orders   Lipid panel   TSH    Other Visit Diagnoses    Well adult exam    -  Primary   Relevant Orders   CBC with Differential/Platelet   CMP14+EGFR   Lipid panel   TSH   Abdominal obesity and metabolic syndrome          Very large recent conference, discussed weight loss and preventing diabetes and other things are coming along.  Patient is agreeable will get back to exercising, we discussed possibility of medications in the future and he wants to hold off but we will discuss it again in the future if he has issues. Follow up plan: Return in about 1 year (around 02/12/2022), or if symptoms worsen or fail to improve, for Physical exam and hypertension.  Counseling provided for all of the vaccine components Orders Placed This Encounter  Procedures  . CBC with Differential/Platelet  . CMP14+EGFR  . Lipid panel  . TSH    Caryl Pina, MD [facility] 02/12/2021, 3:08 PM

## 2021-02-13 LAB — CBC WITH DIFFERENTIAL/PLATELET
Basophils Absolute: 0.1 10*3/uL (ref 0.0–0.2)
Basos: 1 %
EOS (ABSOLUTE): 0.2 10*3/uL (ref 0.0–0.4)
Eos: 2 %
Hematocrit: 38.4 % (ref 37.5–51.0)
Hemoglobin: 12.3 g/dL — ABNORMAL LOW (ref 13.0–17.7)
Immature Grans (Abs): 0 10*3/uL (ref 0.0–0.1)
Immature Granulocytes: 0 %
Lymphocytes Absolute: 2.4 10*3/uL (ref 0.7–3.1)
Lymphs: 27 %
MCH: 28.1 pg (ref 26.6–33.0)
MCHC: 32 g/dL (ref 31.5–35.7)
MCV: 88 fL (ref 79–97)
Monocytes Absolute: 0.5 10*3/uL (ref 0.1–0.9)
Monocytes: 6 %
Neutrophils Absolute: 5.9 10*3/uL (ref 1.4–7.0)
Neutrophils: 64 %
Platelets: 249 10*3/uL (ref 150–450)
RBC: 4.37 x10E6/uL (ref 4.14–5.80)
RDW: 13.6 % (ref 11.6–15.4)
WBC: 9.1 10*3/uL (ref 3.4–10.8)

## 2021-02-13 LAB — LIPID PANEL
Chol/HDL Ratio: 3.3 ratio (ref 0.0–5.0)
Cholesterol, Total: 126 mg/dL (ref 100–199)
HDL: 38 mg/dL — ABNORMAL LOW (ref >39)
LDL Chol Calc (NIH): 71 mg/dL (ref 0–99)
Triglycerides: 90 mg/dL (ref 0–149)
VLDL Cholesterol Cal: 17 mg/dL (ref 5–40)

## 2021-02-13 LAB — CMP14+EGFR
ALT: 19 IU/L (ref 0–44)
AST: 20 IU/L (ref 0–40)
Albumin/Globulin Ratio: 1.4 (ref 1.2–2.2)
Albumin: 4.1 g/dL (ref 4.0–5.0)
Alkaline Phosphatase: 67 IU/L (ref 44–121)
BUN/Creatinine Ratio: 8 — ABNORMAL LOW (ref 9–20)
BUN: 7 mg/dL (ref 6–24)
Bilirubin Total: 0.2 mg/dL (ref 0.0–1.2)
CO2: 23 mmol/L (ref 20–29)
Calcium: 8.9 mg/dL (ref 8.7–10.2)
Chloride: 101 mmol/L (ref 96–106)
Creatinine, Ser: 0.93 mg/dL (ref 0.76–1.27)
GFR calc Af Amer: 116 mL/min/{1.73_m2} (ref >59)
GFR calc non Af Amer: 100 mL/min/{1.73_m2} (ref >59)
Globulin, Total: 3 g/dL (ref 1.5–4.5)
Glucose: 131 mg/dL — ABNORMAL HIGH (ref 65–99)
Potassium: 3.9 mmol/L (ref 3.5–5.2)
Sodium: 137 mmol/L (ref 134–144)
Total Protein: 7.1 g/dL (ref 6.0–8.5)

## 2021-02-13 LAB — TSH: TSH: 1.33 u[IU]/mL (ref 0.450–4.500)

## 2021-02-16 ENCOUNTER — Telehealth: Payer: Self-pay | Admitting: Family Medicine

## 2021-02-16 LAB — HGB A1C W/O EAG: Hgb A1c MFr Bld: 6.2 % — ABNORMAL HIGH (ref 4.8–5.6)

## 2021-02-16 LAB — SPECIMEN STATUS REPORT

## 2021-02-16 NOTE — Telephone Encounter (Signed)
Pt would like to discuss labs - encounter was closed because they were reviewed yesterday.

## 2021-02-16 NOTE — Telephone Encounter (Signed)
Pt informed of lab results. Hgb A1C has been added. Pt aware that we will call with results.

## 2021-07-06 ENCOUNTER — Encounter (HOSPITAL_COMMUNITY): Payer: Self-pay | Admitting: Orthopedic Surgery

## 2021-07-06 NOTE — Progress Notes (Signed)
DUE TO COVID-19 ONLY ONE VISITOR IS ALLOWED TO COME WITH YOU AND STAY IN THE WAITING ROOM ONLY DURING PRE OP AND PROCEDURE DAY OF SURGERY.   PCP - Dr Ivin Booty Dettinger Cardiologist - n/a  Chest x-ray - n/a EKG - DOS 07/10/21 Stress Test - n/a ECHO - 10/24/09 Cardiac Cath - n/a  Sleep Study -  n/a CPAP - none  ERAS: Clear liquids til 11:30 am DOS.  Anesthesia review: Yes  STOP now taking any Aspirin (unless otherwise instructed by your surgeon), Aleve, Naproxen, Ibuprofen, Motrin, Advil, Goody's, BC's, all herbal medications, fish oil, and all vitamins.   Coronavirus Screening Covid test n/a - Ambulatory Surgery  Do you have any of the following symptoms:  Cough yes/no: No Fever (>100.59F)  yes/no: No Runny nose yes/no: No Sore throat yes/no: No Difficulty breathing/shortness of breath  yes/no: No  Have you traveled in the last 14 days and where? yes/no: No  Patient verbalized understanding of instructions that were given via phone.

## 2021-07-10 ENCOUNTER — Other Ambulatory Visit: Payer: Self-pay

## 2021-07-10 ENCOUNTER — Encounter (HOSPITAL_COMMUNITY): Payer: Self-pay | Admitting: Orthopedic Surgery

## 2021-07-10 ENCOUNTER — Encounter (HOSPITAL_COMMUNITY)
Admission: RE | Disposition: A | Discharge status: Home / Self Care | Payer: Self-pay | Source: Home / Self Care | Attending: Orthopedic Surgery

## 2021-07-10 ENCOUNTER — Ambulatory Visit (HOSPITAL_COMMUNITY)
Admission: RE | Admit: 2021-07-10 | Discharge: 2021-07-10 | Disposition: A | Discharge status: Home / Self Care | Attending: Orthopedic Surgery | Admitting: Orthopedic Surgery

## 2021-07-10 ENCOUNTER — Ambulatory Visit (HOSPITAL_COMMUNITY): Admitting: Physician Assistant

## 2021-07-10 DIAGNOSIS — Z79899 Other long term (current) drug therapy: Secondary | ICD-10-CM | POA: Insufficient documentation

## 2021-07-10 DIAGNOSIS — S83241A Other tear of medial meniscus, current injury, right knee, initial encounter: Secondary | ICD-10-CM | POA: Insufficient documentation

## 2021-07-10 DIAGNOSIS — M94261 Chondromalacia, right knee: Secondary | ICD-10-CM | POA: Insufficient documentation

## 2021-07-10 DIAGNOSIS — X58XXXA Exposure to other specified factors, initial encounter: Secondary | ICD-10-CM | POA: Diagnosis not present

## 2021-07-10 DIAGNOSIS — Z87891 Personal history of nicotine dependence: Secondary | ICD-10-CM | POA: Insufficient documentation

## 2021-07-10 HISTORY — DX: Personal history of urinary calculi: Z87.442

## 2021-07-10 HISTORY — DX: Dyslexia and alexia: R48.0

## 2021-07-10 HISTORY — PX: KNEE ARTHROSCOPY WITH MEDIAL MENISECTOMY: SHX5651

## 2021-07-10 LAB — BASIC METABOLIC PANEL
Anion gap: 9 (ref 5–15)
BUN: 8 mg/dL (ref 6–20)
CO2: 22 mmol/L (ref 22–32)
Calcium: 8.5 mg/dL — ABNORMAL LOW (ref 8.9–10.3)
Chloride: 107 mmol/L (ref 98–111)
Creatinine, Ser: 0.81 mg/dL (ref 0.61–1.24)
GFR, Estimated: >60 mL/min (ref >60)
Glucose, Bld: 79 mg/dL (ref 70–99)
Potassium: 3.9 mmol/L (ref 3.5–5.1)
Sodium: 138 mmol/L (ref 135–145)

## 2021-07-10 SURGERY — ARTHROSCOPY, KNEE, WITH MEDIAL MENISCECTOMY
Anesthesia: General | Site: Knee | Laterality: Right

## 2021-07-10 MED ORDER — FENTANYL CITRATE (PF) 250 MCG/5ML IJ SOLN
INTRAMUSCULAR | Status: DC | PRN
  Administered 2021-07-10 (×2): 100 ug via INTRAVENOUS
  Administered 2021-07-10: 50 ug via INTRAVENOUS

## 2021-07-10 MED ORDER — SODIUM CHLORIDE 0.9 % IR SOLN
Status: DC | PRN
  Administered 2021-07-10 (×2): 3000 mL

## 2021-07-10 MED ORDER — ACETAMINOPHEN 10 MG/ML IV SOLN: 1000.0000 mg | Freq: Once | INTRAVENOUS | Status: DC | PRN

## 2021-07-10 MED ORDER — MIDAZOLAM HCL 2 MG/2ML IJ SOLN
INTRAMUSCULAR | Status: AC
  Filled 2021-07-10: qty 2

## 2021-07-10 MED ORDER — FENTANYL CITRATE (PF) 250 MCG/5ML IJ SOLN
INTRAMUSCULAR | Status: AC
  Filled 2021-07-10: qty 5

## 2021-07-10 MED ORDER — CEFAZOLIN IN SODIUM CHLORIDE 3-0.9 GM/100ML-% IV SOLN
3.0000 g | INTRAVENOUS | Status: AC
  Administered 2021-07-10: 3 g via INTRAVENOUS
  Filled 2021-07-10: qty 100

## 2021-07-10 MED ORDER — HYDROCODONE-ACETAMINOPHEN 5-325 MG PO TABS: 2.0000 | ORAL_TABLET | Freq: Four times a day (QID) | ORAL | 0 refills | Status: DC | PRN

## 2021-07-10 MED ORDER — BUPIVACAINE-EPINEPHRINE (PF) 0.25% -1:200000 IJ SOLN
INTRAMUSCULAR | Status: AC
  Filled 2021-07-10: qty 30

## 2021-07-10 MED ORDER — ONDANSETRON 4 MG PO TBDP: 4.0000 mg | ORAL_TABLET | Freq: Three times a day (TID) | ORAL | 0 refills | Status: DC | PRN

## 2021-07-10 MED ORDER — KETOROLAC TROMETHAMINE 30 MG/ML IJ SOLN: 30.0000 mg | Freq: Once | INTRAMUSCULAR | Status: DC

## 2021-07-10 MED ORDER — DEXAMETHASONE SODIUM PHOSPHATE 10 MG/ML IJ SOLN
INTRAMUSCULAR | Status: DC | PRN
  Administered 2021-07-10: 10 mg via INTRAVENOUS

## 2021-07-10 MED ORDER — OXYCODONE HCL 5 MG/5ML PO SOLN: 5.0000 mg | Freq: Once | ORAL | Status: DC | PRN

## 2021-07-10 MED ORDER — CHLORHEXIDINE GLUCONATE 0.12 % MT SOLN: 15.0000 mL | Freq: Once | OROMUCOSAL | Status: AC

## 2021-07-10 MED ORDER — CHLORHEXIDINE GLUCONATE 0.12 % MT SOLN
OROMUCOSAL | Status: AC
  Administered 2021-07-10: 15 mL via OROMUCOSAL
  Filled 2021-07-10: qty 15

## 2021-07-10 MED ORDER — LACTATED RINGERS IV SOLN: INTRAVENOUS | Status: DC

## 2021-07-10 MED ORDER — PROMETHAZINE HCL 25 MG/ML IJ SOLN: 6.2500 mg | INTRAMUSCULAR | Status: DC | PRN

## 2021-07-10 MED ORDER — ONDANSETRON HCL 4 MG/2ML IJ SOLN
INTRAMUSCULAR | Status: DC | PRN
  Administered 2021-07-10: 4 mg via INTRAVENOUS

## 2021-07-10 MED ORDER — ORAL CARE MOUTH RINSE: 15.0000 mL | Freq: Once | OROMUCOSAL | Status: AC

## 2021-07-10 MED ORDER — LIDOCAINE 2% (20 MG/ML) 5 ML SYRINGE
INTRAMUSCULAR | Status: DC | PRN
  Administered 2021-07-10: 100 mg via INTRAVENOUS

## 2021-07-10 MED ORDER — ONDANSETRON HCL 4 MG/2ML IJ SOLN
INTRAMUSCULAR | Status: AC
  Filled 2021-07-10: qty 2

## 2021-07-10 MED ORDER — AMISULPRIDE (ANTIEMETIC) 5 MG/2ML IV SOLN: 10.0000 mg | Freq: Once | INTRAVENOUS | Status: DC | PRN

## 2021-07-10 MED ORDER — BUPIVACAINE-EPINEPHRINE 0.25% -1:200000 IJ SOLN
INTRAMUSCULAR | Status: DC | PRN
  Administered 2021-07-10: 15 mL

## 2021-07-10 MED ORDER — PROPOFOL 10 MG/ML IV BOLUS
INTRAVENOUS | Status: DC | PRN
  Administered 2021-07-10: 200 mg via INTRAVENOUS

## 2021-07-10 MED ORDER — OXYCODONE HCL 5 MG PO TABS: 5.0000 mg | ORAL_TABLET | Freq: Once | ORAL | Status: DC | PRN

## 2021-07-10 MED ORDER — DEXAMETHASONE SODIUM PHOSPHATE 10 MG/ML IJ SOLN
INTRAMUSCULAR | Status: AC
  Filled 2021-07-10: qty 1

## 2021-07-10 MED ORDER — DEXMEDETOMIDINE (PRECEDEX) IN NS 20 MCG/5ML (4 MCG/ML) IV SYRINGE
PREFILLED_SYRINGE | INTRAVENOUS | Status: DC | PRN
  Administered 2021-07-10: 12 ug via INTRAVENOUS

## 2021-07-10 MED ORDER — SUCCINYLCHOLINE CHLORIDE 200 MG/10ML IV SOSY
PREFILLED_SYRINGE | INTRAVENOUS | Status: DC | PRN
  Administered 2021-07-10: 200 mg via INTRAVENOUS

## 2021-07-10 MED ORDER — FENTANYL CITRATE (PF) 100 MCG/2ML IJ SOLN: 25.0000 ug | INTRAMUSCULAR | Status: DC | PRN

## 2021-07-10 MED ORDER — MIDAZOLAM HCL 5 MG/5ML IJ SOLN
INTRAMUSCULAR | Status: DC | PRN
  Administered 2021-07-10: 2 mg via INTRAVENOUS

## 2021-07-10 MED ORDER — METHOCARBAMOL 500 MG PO TABS: 500.0000 mg | ORAL_TABLET | Freq: Four times a day (QID) | ORAL | 1 refills | Status: DC | PRN

## 2021-07-10 SURGICAL SUPPLY — 31 items
BAG COUNTER SPONGE SURGICOUNT (BAG) ×2 IMPLANT
BLADE SHAVER TORPEDO 4X13 (MISCELLANEOUS) ×2 IMPLANT
BNDG ELASTIC 6X15 VLCR STRL LF (GAUZE/BANDAGES/DRESSINGS) ×2 IMPLANT
BNDG ELASTIC 6X5.8 VLCR STR LF (GAUZE/BANDAGES/DRESSINGS) ×2 IMPLANT
CLSR STERI-STRIP ANTIMIC 1/2X4 (GAUZE/BANDAGES/DRESSINGS) ×2 IMPLANT
DRAPE ARTHROSCOPY W/POUCH 114 (DRAPES) ×2 IMPLANT
DRAPE U-SHAPE 47X51 STRL (DRAPES) ×2 IMPLANT
DRSG PAD ABDOMINAL 8X10 ST (GAUZE/BANDAGES/DRESSINGS) ×2 IMPLANT
DRSG XEROFORM 1X8 (GAUZE/BANDAGES/DRESSINGS) ×2 IMPLANT
DURAPREP 26ML APPLICATOR (WOUND CARE) ×2 IMPLANT
GAUZE 4X4 16PLY ~~LOC~~+RFID DBL (SPONGE) ×2 IMPLANT
GAUZE SPONGE 4X4 12PLY STRL (GAUZE/BANDAGES/DRESSINGS) ×2 IMPLANT
GAUZE XEROFORM 1X8 LF (GAUZE/BANDAGES/DRESSINGS) ×2 IMPLANT
GLOVE SRG 8 PF TXTR STRL LF DI (GLOVE) ×2 IMPLANT
GLOVE SURG ENC MOIS LTX SZ7.5 (GLOVE) ×4 IMPLANT
GLOVE SURG UNDER POLY LF SZ8 (GLOVE) ×2
GOWN STRL REUS W/ TWL LRG LVL3 (GOWN DISPOSABLE) ×1 IMPLANT
GOWN STRL REUS W/ TWL XL LVL3 (GOWN DISPOSABLE) ×2 IMPLANT
GOWN STRL REUS W/TWL LRG LVL3 (GOWN DISPOSABLE) ×1
GOWN STRL REUS W/TWL XL LVL3 (GOWN DISPOSABLE) ×2
KIT BASIN OR (CUSTOM PROCEDURE TRAY) ×2 IMPLANT
MANIFOLD NEPTUNE II (INSTRUMENTS) ×2 IMPLANT
NEEDLE PRECISIONGLIDE 27X1.5 (NEEDLE) ×2 IMPLANT
PACK ARTHROSCOPY DSU (CUSTOM PROCEDURE TRAY) ×2 IMPLANT
PADDING CAST COTTON 6X4 STRL (CAST SUPPLIES) ×2 IMPLANT
SUT ETHILON 4 0 PS 2 18 (SUTURE) ×2 IMPLANT
SUT MNCRL AB 3-0 PS2 27 (SUTURE) ×2 IMPLANT
SYR CONTROL 10ML LL (SYRINGE) ×2 IMPLANT
TOWEL GREEN STERILE (TOWEL DISPOSABLE) ×2 IMPLANT
TUBING ARTHROSCOPY IRRIG 16FT (MISCELLANEOUS) ×2 IMPLANT
WRAP KNEE MAXI GEL POST OP (GAUZE/BANDAGES/DRESSINGS) ×2 IMPLANT

## 2021-07-10 NOTE — Discharge Instructions (Signed)

## 2021-07-10 NOTE — H&P (Signed)
[facility]  REQUESTING PHYSICIAN: Yolonda Kida, MD  PCP:  Dettinger, Elige Radon, MD  Chief Complaint: Right knee pain  HPI: Tyler Wheeler is a 45 y.o. male who complains of right knee pain and mechanical symptoms since an injury to the right knee.  He is here today for arthroscopic intervention with partial medial meniscectomy.  We have discussed this in the office in detail.  No new complaints today.  Past Medical History:  Diagnosis Date   Dyslexia    History of kidney stones    passed stones   Hypertension    Past Surgical History:  Procedure Laterality Date   APPENDECTOMY     KNEE SURGERY     Right   RETROPERITONEAL LYMPH NODE EXCISION     Right side   SHOULDER SURGERY     Right   Social History   Socioeconomic History   Marital status: Married    Spouse name: Not on file   Number of children: Not on file   Years of education: Not on file   Highest education level: Not on file  Occupational History   Occupation: [employer]  Tobacco Use   Smoking status: Never   Smokeless tobacco: Former    Types: Snuff    Quit date: 2009  Vaping Use   Vaping Use: Never used  Substance and Sexual Activity   Alcohol use: No   Drug use: No   Sexual activity: Not on file  Other Topics Concern   Not on file  Social History Narrative   Married   No regular exercise   Social Determinants of Health   Financial Resource Strain: Not on file  Food Insecurity: Not on file  Transportation Needs: Not on file  Physical Activity: Not on file  Stress: Not on file  Social Connections: Not on file   Family History  Problem Relation Age of Onset   Hypertension Father    Aortic aneurysm Paternal Grandmother    Allergies  Allergen Reactions   No Known Allergies    Prior to Admission medications   Medication Sig Start Date End Date Taking? Authorizing Provider  acetaminophen (TYLENOL) 500 MG tablet Take 500 mg by mouth every 6 (six) hours as needed for mild pain  or moderate pain.   Yes [provider]  ferrous sulfate 325 (65 FE) MG tablet Take 325 mg by mouth daily with breakfast.   Yes [provider]  ibuprofen (ADVIL) 600 MG tablet Take 600 mg by mouth every 8 (eight) hours as needed for moderate pain or mild pain. 05/23/21  Yes [provider]  lisinopril-hydrochlorothiazide (ZESTORETIC) 20-25 MG tablet Take 0.5 tablets by mouth daily. Patient taking differently: Take 0.5 tablets by mouth at bedtime. 02/12/21  Yes Dettinger, Elige Radon, MD  Multiple Vitamins-Minerals (MULTIVITAMIN WITH MINERALS) tablet Take 1 tablet by mouth daily.   Yes [provider]   No results found.  Positive ROS: All other systems have been reviewed and were otherwise negative with the exception of those mentioned in the HPI and as above.  Physical Exam: General: Alert, no acute distress Cardiovascular: No pedal edema Respiratory: No cyanosis, no use of accessory musculature GI: No organomegaly, abdomen is soft and non-tender Skin: No lesions in the area of chief complaint Neurologic: Sensation intact distally Psychiatric: Patient is competent for consent with normal mood and affect Lymphatic: No axillary or cervical lymphadenopathy  MUSCULOSKELETAL:  Right lower extremity is warm well perfused.  No open wounds  or lesions.  Neurovascularly intact.  Assessment: Right knee medial meniscus tear, acute.  Plan: Plan to proceed today with arthroscopic surgery on the right knee.  Plan for arthroscopic partial medial meniscectomy and chondroplasty as indicated.  We again discussed the risk of bleeding, infection, damage to surrounding nerves and vessels, stiffness, DVT, persistent pain, need for further surgery, and risk of anesthesia.  He has provided informed consent.    Yolonda Kida, MD Cell [phone]    07/10/2021 12:41 PM

## 2021-07-10 NOTE — Anesthesia Procedure Notes (Signed)
Procedure Name: Intubation Date/Time: 07/10/2021 2:44 PM Performed by: Rosiland Oz, CRNA Pre-anesthesia Checklist: Patient identified, Emergency Drugs available, Suction available, Patient being monitored and Timeout performed Patient Re-evaluated:Patient Re-evaluated prior to induction Oxygen Delivery Method: Circle system utilized Preoxygenation: Pre-oxygenation with 100% oxygen Induction Type: IV induction Ventilation: Mask ventilation without difficulty Laryngoscope Size: Miller and 3 Grade View: Grade I Tube type: Oral Tube size: 7.5 mm Number of attempts: 1 Airway Equipment and Method: Stylet Placement Confirmation: ETT inserted through vocal cords under direct vision, positive ETCO2 and breath sounds checked- equal and bilateral Secured at: 23 cm Tube secured with: Tape Dental Injury: Teeth and Oropharynx as per pre-operative assessment

## 2021-07-10 NOTE — Transfer of Care (Signed)
Immediate Anesthesia Transfer of Care Note  Patient: Tyler Wheeler  Procedure(s) Performed: KNEE ARTHROSCOPY WITH PARTIAL  MEDIAL MENISECTOMY (Right: Knee)  Patient Location: PACU  Anesthesia Type:General  Level of Consciousness: drowsy and patient cooperative  Airway & Oxygen Therapy: Patient Spontanous Breathing  Post-op Assessment: Report given to RN and Post -op Vital signs reviewed and stable  Post vital signs: Reviewed and stable  Last Vitals:  Vitals Value Taken Time  BP 156/86 07/10/21 1530  Temp    Pulse 90 07/10/21 1531  Resp 17 07/10/21 1531  SpO2 94 % 07/10/21 1531  Vitals shown include unvalidated device data.  Last Pain:  Vitals:   07/10/21 1250  TempSrc:   PainSc: 7       Patients Stated Pain Goal: 8 (07/10/21 1250)  Complications: No notable events documented.

## 2021-07-10 NOTE — Brief Op Note (Signed)
07/10/2021  3:17 PM  PATIENT:  Tyler Wheeler  45 y.o. 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  PROCEDURE:  Procedure(s) with comments: KNEE ARTHROSCOPY WITH PARTIAL  MEDIAL MENISECTOMY (Right) - Right knee arthroscopic chondroplasty of trochlea and patella  SURGEON:  Surgeon(s) and Role:    * Yolonda Kida, MD - Primary  PHYSICIAN ASSISTANT: Dion Saucier, PA-C  ANESTHESIA:   local and general  EBL:  5 cc  BLOOD ADMINISTERED:none  DRAINS: none   LOCAL MEDICATIONS USED:  NONE  SPECIMEN:  No Specimen  DISPOSITION OF SPECIMEN:  N/A  COUNTS:  YES  TOURNIQUET:  * No tourniquets in log *  DICTATION: .Note written in EPIC  PLAN OF CARE: Discharge to home after PACU  PATIENT DISPOSITION:  PACU - hemodynamically stable.   Delay start of Pharmacological VTE agent (>24hrs) due to surgical blood loss or risk of bleeding: not applicable

## 2021-07-10 NOTE — Progress Notes (Signed)
Orthopedic Tech Progress Note Patient Details:  Hyatt Capobianco 1976/05/02 [id number]  PACU RN called requesting a pair of CRUTCHES for patient.   Ortho Devices Type of Ortho Device: Crutches Ortho Device/Splint Interventions: Application, Adjustment   Post Interventions Patient Tolerated: Ambulated well, Well Instructions Provided: Poper ambulation with device, Care of device  Donald Pore 07/10/2021, 5:10 PM

## 2021-07-10 NOTE — Anesthesia Preprocedure Evaluation (Addendum)
Anesthesia Evaluation  Patient identified by MRN, date of birth, ID band Patient awake    Reviewed: Allergy & Precautions, NPO status , Patient's Chart, lab work & pertinent test results  Airway Mallampati: III  TM Distance: >3 FB Neck ROM: Full    Dental no notable dental hx.    Pulmonary neg pulmonary ROS,    Pulmonary exam normal breath sounds clear to auscultation       Cardiovascular hypertension, Pt. on medications Normal cardiovascular exam Rhythm:Regular Rate:Normal  ECG: NSR, rate 71   Neuro/Psych negative neurological ROS  negative psych ROS   GI/Hepatic negative GI ROS, Neg liver ROS,   Endo/Other  Morbid obesity (SUPER)  Renal/GU negative Renal ROS  negative genitourinary   Musculoskeletal negative musculoskeletal ROS (+)   Abdominal (+) + obese,   Peds negative pediatric ROS (+)  Hematology negative hematology ROS (+)   Anesthesia Other Findings right knee medial meniscus tear  Reproductive/Obstetrics negative OB ROS                           Anesthesia Physical Anesthesia Plan  ASA: 4  Anesthesia Plan: General   Post-op Pain Management:    Induction: Intravenous  PONV Risk Score and Plan: 2 and Ondansetron, Dexamethasone, Midazolam and Treatment may vary due to age or medical condition  Airway Management Planned: Oral ETT  Additional Equipment:   Intra-op Plan:   Post-operative Plan: Extubation in OR  Informed Consent: I have reviewed the patients History and Physical, chart, labs and discussed the procedure including the risks, benefits and alternatives for the proposed anesthesia with the patient or authorized representative who has indicated his/her understanding and acceptance.     Dental advisory given  Plan Discussed with: CRNA and Surgeon  Anesthesia Plan Comments:        Anesthesia Quick Evaluation

## 2021-07-10 NOTE — Op Note (Signed)
Surgery Date: 07/10/2021  Surgeon(s): Yolonda Kida, MD  Assistant:  Dion Saucier, PA-C  Assistant attestation: PA Mcclung was present for the entire procedure and participated in all portions of the case.  ANESTHESIA:  general  FLUIDS: Per anesthesia record.   ESTIMATED BLOOD LOSS: minimal  PREOPERATIVE DIAGNOSES:  1. Right knee medial meniscus tear   POSTOPERATIVE DIAGNOSES:  Right knee medial meniscus tear, acute Right knee grade IV chondromalacia of the trochlea and patella  PROCEDURES PERFORMED:  1. knee arthroscopy with arthroscopic partial medial meniscectomy 2. knee arthroscopy with arthroscopic chondroplasty trochlea and patella with mechanical debridement  DESCRIPTION OF PROCEDURE: Tyler Wheeler is a 45 y.o.-year-old male with right knee medial meniscus tear. Plans are to proceed with partial medial meniscectomy and diagnostic arthroscopy with debridement as indicated. Full discussion held regarding risks benefits alternatives and complications related surgical intervention. Conservative care options reviewed. All questions answered.  The patient was identified in the preoperative holding area and the operative extremity was marked. The patient was brought to the operating room and transferred to operating table in a supine position. Satisfactory general anesthesia was induced by anesthesiology.    Standard anterolateral, anteromedial arthroscopy portals were obtained. The anteromedial portal was obtained with a spinal needle for localization under direct visualization with subsequent diagnostic findings.   Anteromedial and anterolateral chambers: moderate synovitis. The synovitis was debrided with a 4.5 mm full radius shaver through both the anteromedial and lateral portals.   Suprapatellar pouch and gutters: no synovitis or debris. Patella chondral surface: Grade 4 Trochlear chondral surface: Grade 4 Patellofemoral tracking: midline Medial meniscus:  Undersurface flap tear horizontal in nature in the mid body and free edge horizontal tearing posterior horn.  Medial femoral condyle flexion bearing surface: Grade 0 Medial femoral condyle extension bearing surface: Grade 0 Medial tibial plateau: Grade 0 Anterior cruciate ligament:stable Posterior cruciate ligament:stable Lateral meniscus: Intact.   Lateral femoral condyle flexion bearing surface: Grade 0 Lateral femoral condyle extension bearing surface: Grade 0 Lateral tibial plateau: Grade 0  Arthroscopic chondroplasty was carried out with combination of meniscal biter and motorized shaver.  We contoured unstable flap tears to stable shoulders.  All loose chondral flaps were debrided.  Partial medial meniscectomy was carried out with combination of meniscal biter and motorized shaver.  Undersurface flap tear was removed en bloc.  We did use the meniscal biter to remove the horizontal free edge tearing from the posterior horn as well.  This was smoothed and tailored with the motorized shaver.  After completion of synovectomy, diagnostic exam, and debridements as described, all compartments were checked and no residual debris remained. Hemostasis was achieved with the cautery wand. The portals were approximated with buried monocryl. All excess fluid was expressed from the joint.  Xeroform sterile gauze dressings were applied followed by Ace bandage and ice pack.   DISPOSITION: The patient was awakened from general anesthetic, extubated, taken to the recovery room in medically stable condition, no apparent complications. The patient may be weightbearing as tolerated to the operative lower extremity.  Range of motion of right knee as tolerated.

## 2021-07-11 ENCOUNTER — Encounter (HOSPITAL_COMMUNITY): Payer: Self-pay | Admitting: Orthopedic Surgery

## 2021-07-11 NOTE — Anesthesia Postprocedure Evaluation (Signed)
Anesthesia Post Note  Patient: Tyler Wheeler  Procedure(s) Performed: KNEE ARTHROSCOPY WITH PARTIAL  MEDIAL MENISECTOMY (Right: Knee)     Patient location during evaluation: PACU Anesthesia Type: General Level of consciousness: awake and alert Pain management: pain level controlled Vital Signs Assessment: post-procedure vital signs reviewed and stable Respiratory status: spontaneous breathing, nonlabored ventilation, respiratory function stable and patient connected to nasal cannula oxygen Cardiovascular status: blood pressure returned to baseline and stable Postop Assessment: no apparent nausea or vomiting Anesthetic complications: no   No notable events documented.  Last Vitals:  Vitals:   07/10/21 1615 07/10/21 1627  BP: (!) 136/91 (!) 141/92  Pulse: 76 74  Resp: 15 17  Temp:  36.4 C  SpO2: 100% 100%    Last Pain:  Vitals:   07/10/21 1627  TempSrc:   PainSc: 0-No pain                 Brielyn Bosak S

## 2022-01-03 ENCOUNTER — Telehealth: Payer: Self-pay | Admitting: Family Medicine

## 2022-01-03 NOTE — Telephone Encounter (Signed)
Last night had hematuria.  No pain in back  Some pain with urination.  Need pm appt after 3:30  Dr. Warrick Parisian did not have any appts.  Scheduled with Shelah Lewandowsky at 4:15 on 1/6

## 2022-01-03 NOTE — Telephone Encounter (Signed)
Pt called requesting to speak with pcp or nurse regarding some questions he has about his health before making an appt.

## 2022-01-04 ENCOUNTER — Ambulatory Visit: Payer: BC Managed Care – PPO | Admitting: Nurse Practitioner

## 2022-01-04 ENCOUNTER — Encounter: Payer: Self-pay | Admitting: Nurse Practitioner

## 2022-01-04 VITALS — BP 140/96 | HR 91 | Temp 98.3°F | Resp 20 | Ht 74.0 in | Wt >= 6400 oz

## 2022-01-04 DIAGNOSIS — R310 Gross hematuria: Secondary | ICD-10-CM

## 2022-01-04 NOTE — Progress Notes (Signed)
° °  Subjective:    Patient ID: Tyler Wheeler, male    DOB: [birth year], 46 y.o.   MRN: [id number]   Chief Complaint: Hematuria   HPI Patient come sin today stating tha Wednesday  morning he went to void and he had bright red blood. As day wore on it cleared. He has sent a slight tinge of blood the last few days. He denies any back pain or pelvic pain. No dysuria urgency or frequency.    Review of Systems  Constitutional:  Negative for diaphoresis.  Eyes:  Negative for pain.  Respiratory:  Negative for shortness of breath.   Cardiovascular:  Negative for chest pain, palpitations and leg swelling.  Gastrointestinal:  Negative for abdominal pain.  Endocrine: Negative for polydipsia.  Skin:  Negative for rash.  Neurological:  Negative for dizziness, weakness and headaches.  Hematological:  Does not bruise/bleed easily.  All other systems reviewed and are negative.     Objective:   Physical Exam Vitals reviewed.  Constitutional:      Appearance: Normal appearance. He is obese.  Cardiovascular:     Rate and Rhythm: Normal rate and regular rhythm.     Heart sounds: Normal heart sounds.  Pulmonary:     Breath sounds: Normal breath sounds.  Abdominal:     General: Abdomen is flat. Bowel sounds are normal.     Palpations: Abdomen is soft.     Tenderness: There is no abdominal tenderness. There is no right CVA tenderness or left CVA tenderness.  Skin:    General: Skin is warm and dry.  Neurological:     General: No focal deficit present.     Mental Status: He is alert and oriented to person, place, and time.    BP (!) 140/96    Pulse 91    Temp 98.3 F (36.8 C) (Temporal)    Resp 20    Ht 6\' 2"  (1.88 m)    Wt (!) 422 lb (191.4 kg)    SpO2 96%    BMI 54.18 kg/m        Assessment & Plan:   Tyler Wheeler in today with chief complaint of Hematuria   1. Gross hematuria Force fluids Avoid caffeine and acidic drinks - Urinalysis, Complete - Ambulatory referral to  Urology    The above assessment and management plan was discussed with the patient. The patient verbalized understanding of and has agreed to the management plan. Patient is aware to call the clinic if symptoms persist or worsen. Patient is aware when to return to the clinic for a follow-up visit. Patient educated on when it is appropriate to go to the emergency department.   Mary-Margaret Hassell Done, FNP

## 2022-01-07 LAB — MICROSCOPIC EXAMINATION
RBC, Urine: NONE SEEN /HPF (ref 0–2)
Renal Epithel, UA: NONE SEEN /HPF

## 2022-01-07 LAB — URINALYSIS, COMPLETE
Bilirubin, UA: NEGATIVE
Glucose, UA: NEGATIVE
Ketones, UA: NEGATIVE
Nitrite, UA: NEGATIVE
Protein,UA: NEGATIVE
Specific Gravity, UA: 1.01 (ref 1.005–1.030)
Urobilinogen, Ur: 0.2 mg/dL (ref 0.2–1.0)
pH, UA: 5.5 (ref 5.0–7.5)

## 2022-01-09 ENCOUNTER — Telehealth: Payer: Self-pay | Admitting: Family Medicine

## 2022-01-09 NOTE — Telephone Encounter (Signed)
REFERRAL REQUEST Telephone Note  Have you been seen at our office for this problem? yes (Advise that they may need an appointment with their PCP before a referral can be done)  Reason for Referral: Urology? Referral discussed with patient: yes  Best contact number of patient for referral team: [phone]    Has patient been seen by a specialist for this issue before: no  Patient provider preference for referral: none Patient location preference for referral: [facility]   Patient notified that referrals can take up to a week or longer to process. If they haven't heard anything within a week they should call back and speak with the referral department.    [facility]tinger's pt.  Pt seen MMM.  Please call pt.

## 2022-02-07 ENCOUNTER — Other Ambulatory Visit (HOSPITAL_COMMUNITY): Payer: Self-pay | Admitting: Urology

## 2022-02-07 ENCOUNTER — Other Ambulatory Visit: Payer: Self-pay | Admitting: Urology

## 2022-02-07 DIAGNOSIS — R310 Gross hematuria: Secondary | ICD-10-CM

## 2022-02-13 ENCOUNTER — Ambulatory Visit (INDEPENDENT_AMBULATORY_CARE_PROVIDER_SITE_OTHER): Payer: BC Managed Care – PPO | Admitting: Family Medicine

## 2022-02-13 ENCOUNTER — Encounter: Payer: Self-pay | Admitting: Family Medicine

## 2022-02-13 VITALS — BP 107/67 | HR 98 | Ht 74.0 in | Wt >= 6400 oz

## 2022-02-13 DIAGNOSIS — I10 Essential (primary) hypertension: Secondary | ICD-10-CM | POA: Diagnosis not present

## 2022-02-13 DIAGNOSIS — Z23 Encounter for immunization: Secondary | ICD-10-CM

## 2022-02-13 DIAGNOSIS — Z0001 Encounter for general adult medical examination with abnormal findings: Secondary | ICD-10-CM

## 2022-02-13 DIAGNOSIS — Z0000 Encounter for general adult medical examination without abnormal findings: Secondary | ICD-10-CM

## 2022-02-13 MED ORDER — LISINOPRIL-HYDROCHLOROTHIAZIDE 20-25 MG PO TABS: 0.5000 | ORAL_TABLET | Freq: Every day | ORAL | 3 refills | Status: DC

## 2022-02-13 NOTE — Progress Notes (Signed)
BP 107/67    Pulse 98    Ht 6' 2"  (1.88 m)    Wt (!) 419 lb (190.1 kg)    SpO2 99%    BMI 53.80 kg/m    Subjective:   Patient ID: Tyler Wheeler, male    DOB: [birth year], 46 y.o.   MRN: [id number]  HPI: Tyler Wheeler is a 46 y.o. male presenting on 02/13/2022 for Medical Management of Chronic Issues (CPE)   HPI Physical exam Patient denies any chest pain, shortness of breath, headaches or vision issues, abdominal complaints, diarrhea, nausea, vomiting, or joint issues.  Patient says he still gets some swelling in his legs.  He has been using compression stockings but they do not seem to fit right.  Recommended Ace bandage wraps which are thicker  Hypertension Patient is currently on lisinopril hydrochlorothiazide, and their blood pressure today is 106/67. Patient denies any lightheadedness or dizziness. Patient denies headaches, blurred vision, chest pains, shortness of breath, or weakness. Denies any side effects from medication and is content with current medication.   Relevant past medical, surgical, family and social history reviewed and updated as indicated. Interim medical history since our last visit reviewed. Allergies and medications reviewed and updated.  Review of Systems  Constitutional:  Negative for chills and fever.  Eyes:  Negative for discharge and visual disturbance.  Respiratory:  Negative for shortness of breath and wheezing.   Cardiovascular:  Positive for leg swelling (1+ bilateral lower extremity edema). Negative for chest pain.  Musculoskeletal:  Negative for back pain and gait problem.  Skin:  Negative for rash.  Neurological:  Negative for dizziness, weakness and light-headedness.  All other systems reviewed and are negative.  Per HPI unless specifically indicated above   Allergies as of 02/13/2022       Reactions   No Known Allergies         Medication List        Accurate as of February 13, 2022  3:34 PM. If you have any questions, ask your nurse  or doctor.          acetaminophen 500 MG tablet Commonly known as: TYLENOL Take 500 mg by mouth every 6 (six) hours as needed for mild pain or moderate pain.   ferrous sulfate 325 (65 FE) MG tablet Take 325 mg by mouth daily with breakfast.   ibuprofen 600 MG tablet Commonly known as: ADVIL Take 600 mg by mouth every 8 (eight) hours as needed for moderate pain or mild pain.   lisinopril-hydrochlorothiazide 20-25 MG tablet Commonly known as: ZESTORETIC Take 0.5 tablets by mouth daily. What changed: when to take this   multivitamin with minerals tablet Take 1 tablet by mouth daily.         Objective:   BP 107/67    Pulse 98    Ht 6' 2"  (1.88 m)    Wt (!) 419 lb (190.1 kg)    SpO2 99%    BMI 53.80 kg/m   Wt Readings from Last 3 Encounters:  02/13/22 (!) 419 lb (190.1 kg)  01/04/22 (!) 422 lb (191.4 kg)  07/10/21 (!) 417 lb 15.9 oz (189.6 kg)    Physical Exam Vitals and nursing note reviewed.  Constitutional:      General: He is not in acute distress.    Appearance: He is well-developed. He is not diaphoretic.  Eyes:     General: No scleral icterus.    Conjunctiva/sclera: Conjunctivae normal.  Neck:     Thyroid:  No thyromegaly.  Cardiovascular:     Rate and Rhythm: Normal rate and regular rhythm.     Heart sounds: Normal heart sounds. No murmur heard. Pulmonary:     Effort: Pulmonary effort is normal. No respiratory distress.     Breath sounds: Normal breath sounds. No wheezing.  Abdominal:     General: Abdomen is flat. Bowel sounds are normal. There is no distension.     Tenderness: There is no abdominal tenderness. There is no right CVA tenderness, left CVA tenderness, guarding or rebound.  Musculoskeletal:        General: Normal range of motion.     Cervical back: Neck supple.  Lymphadenopathy:     Cervical: No cervical adenopathy.  Skin:    General: Skin is warm and dry.     Findings: No rash.  Neurological:     Mental Status: He is alert and  oriented to person, place, and time.     Coordination: Coordination normal.  Psychiatric:        Behavior: Behavior normal.      Assessment & Plan:   Problem List Items Addressed This Visit       Cardiovascular and Mediastinum   Essential hypertension   Relevant Medications   lisinopril-hydrochlorothiazide (ZESTORETIC) 20-25 MG tablet   Other Relevant Orders   CMP14+EGFR   Lipid panel   Other Visit Diagnoses     Well adult exam    -  Primary   Relevant Orders   CBC with Differential/Platelet   CMP14+EGFR   Lipid panel   Need for Tdap vaccination       Relevant Orders   Tdap vaccine greater than or equal to 7yo IM (Completed)       Recommended compression socks for bilateral lower legs. As we are going out of the room patient discussed that he has been having issues with falling asleep very quickly when he sits down for a movie or anything else.  He does admit to snoring.  He does sound like he has some sleep apnea symptoms.  At this point he would like to wait and lose weight like he has been and see if it will improve.  Patient declined colonoscopy or Cologuard at this point but he is getting close to his 46 year old birthday. Follow up plan: Return in about 1 year (around 02/13/2023), or if symptoms worsen or fail to improve, for Physical and hypertension.  Counseling provided for all of the vaccine components Orders Placed This Encounter  Procedures   Tdap vaccine greater than or equal to 7yo IM   CBC with Differential/Platelet   CMP14+EGFR   Lipid panel    Caryl Pina, MD [facility] 02/13/2022, 3:34 PM

## 2022-02-14 ENCOUNTER — Other Ambulatory Visit: Payer: Self-pay

## 2022-02-14 ENCOUNTER — Ambulatory Visit (HOSPITAL_COMMUNITY)
Admission: RE | Admit: 2022-02-14 | Discharge: 2022-02-14 | Disposition: A | Discharge status: Home / Self Care | Payer: BC Managed Care – PPO | Source: Ambulatory Visit | Attending: Urology | Admitting: Urology

## 2022-02-14 DIAGNOSIS — R310 Gross hematuria: Secondary | ICD-10-CM | POA: Diagnosis not present

## 2022-02-14 LAB — CBC WITH DIFFERENTIAL/PLATELET
Basophils Absolute: 0.1 10*3/uL (ref 0.0–0.2)
Basos: 1 %
EOS (ABSOLUTE): 0.1 10*3/uL (ref 0.0–0.4)
Eos: 2 %
Hematocrit: 39.9 % (ref 37.5–51.0)
Hemoglobin: 13.2 g/dL (ref 13.0–17.7)
Immature Grans (Abs): 0 10*3/uL (ref 0.0–0.1)
Immature Granulocytes: 0 %
Lymphocytes Absolute: 2.7 10*3/uL (ref 0.7–3.1)
Lymphs: 31 %
MCH: 29.3 pg (ref 26.6–33.0)
MCHC: 33.1 g/dL (ref 31.5–35.7)
MCV: 89 fL (ref 79–97)
Monocytes Absolute: 0.6 10*3/uL (ref 0.1–0.9)
Monocytes: 7 %
Neutrophils Absolute: 5.1 10*3/uL (ref 1.4–7.0)
Neutrophils: 59 %
Platelets: 243 10*3/uL (ref 150–450)
RBC: 4.5 x10E6/uL (ref 4.14–5.80)
RDW: 13.1 % (ref 11.6–15.4)
WBC: 8.6 10*3/uL (ref 3.4–10.8)

## 2022-02-14 LAB — LIPID PANEL
Chol/HDL Ratio: 3.4 ratio (ref 0.0–5.0)
Cholesterol, Total: 124 mg/dL (ref 100–199)
HDL: 37 mg/dL — ABNORMAL LOW (ref >39)
LDL Chol Calc (NIH): 72 mg/dL (ref 0–99)
Triglycerides: 71 mg/dL (ref 0–149)
VLDL Cholesterol Cal: 15 mg/dL (ref 5–40)

## 2022-02-14 LAB — CMP14+EGFR
ALT: 24 IU/L (ref 0–44)
AST: 23 IU/L (ref 0–40)
Albumin/Globulin Ratio: 1.4 (ref 1.2–2.2)
Albumin: 4.2 g/dL (ref 4.0–5.0)
Alkaline Phosphatase: 57 IU/L (ref 44–121)
BUN/Creatinine Ratio: 11 (ref 9–20)
BUN: 11 mg/dL (ref 6–24)
Bilirubin Total: 0.3 mg/dL (ref 0.0–1.2)
CO2: 25 mmol/L (ref 20–29)
Calcium: 9.2 mg/dL (ref 8.7–10.2)
Chloride: 102 mmol/L (ref 96–106)
Creatinine, Ser: 0.97 mg/dL (ref 0.76–1.27)
Globulin, Total: 2.9 g/dL (ref 1.5–4.5)
Glucose: 97 mg/dL (ref 70–99)
Potassium: 4.2 mmol/L (ref 3.5–5.2)
Sodium: 139 mmol/L (ref 134–144)
Total Protein: 7.1 g/dL (ref 6.0–8.5)
eGFR: 99 mL/min/{1.73_m2} (ref >59)

## 2022-02-14 MED ORDER — IOHEXOL 300 MG/ML  SOLN
100.0000 mL | Freq: Once | INTRAMUSCULAR | Status: AC | PRN
  Administered 2022-02-14: 100 mL via INTRAVENOUS

## 2023-01-20 IMAGING — CT CT ABD-PEL WO/W CM
4 of 12 series · 13 of 46 positions shown, 17 images · IV contrast (agent unspecified)
Comparison: None.

CLINICAL DATA: One month of gross hematuria.

EXAM:
CT ABDOMEN AND PELVIS WITHOUT AND WITH CONTRAST
TECHNIQUE: Multidetector CT imaging of the abdomen and pelvis was performed
following the standard protocol before and following the bolus
administration of intravenous contrast.

[Series 5: axial post · axial · 0.98mm/px · z∈[+1090,+1275]mm · 2 of 111 slices shown, 5 images]
[im 37/111  soft-tissue]
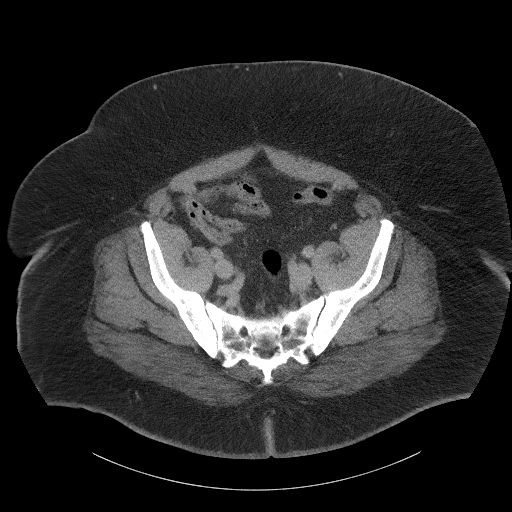
[im 37/111  lung]
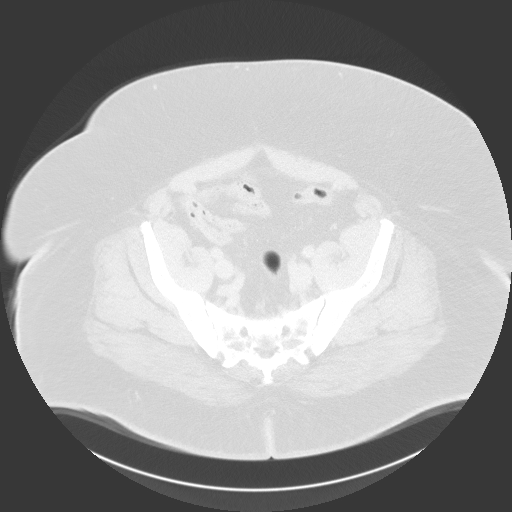
[im 37/111  bone]
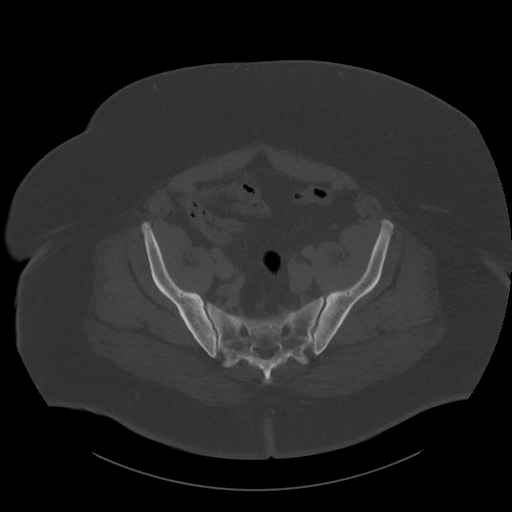
[im 74/111  soft-tissue]
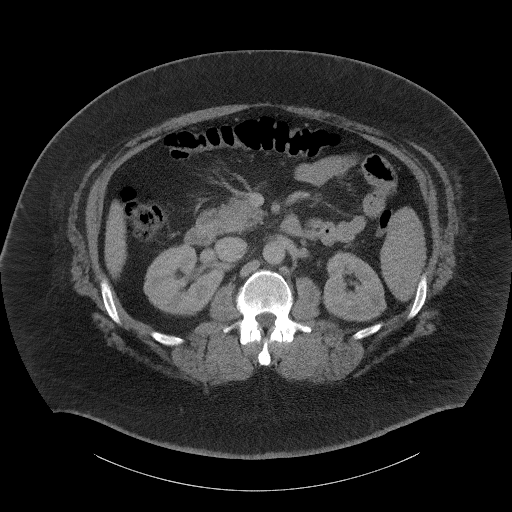
[im 74/111  lung]
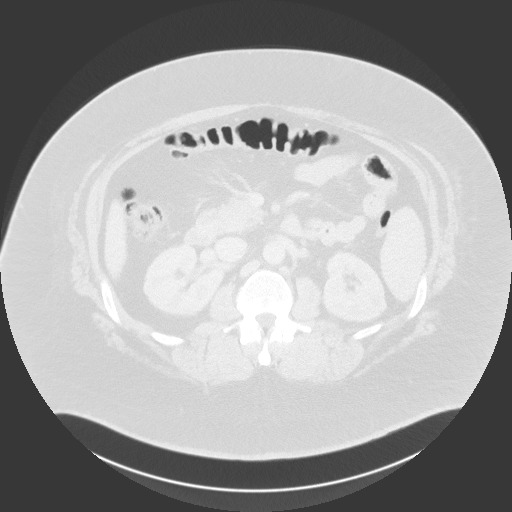

[Series 8: coronal pre · coronal · non-contrast · 0.99mm/px · 2 of 133 slices shown, 3 images]
[im 45/133  soft-tissue]
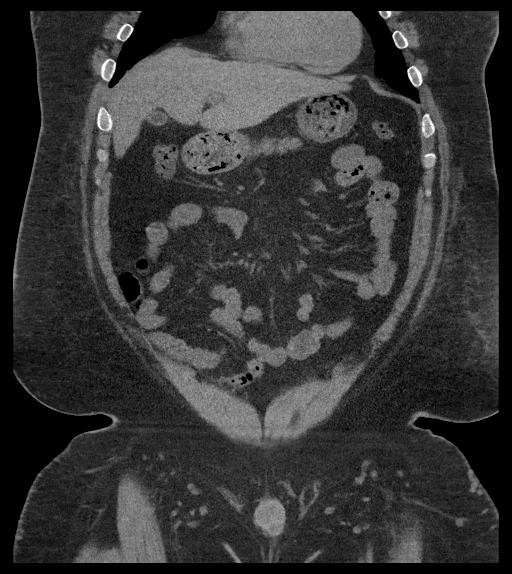
[im 45/133  bone]
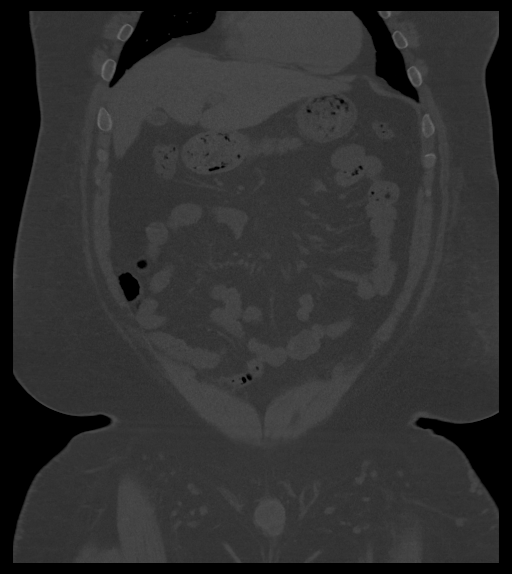
[im 89/133  soft-tissue]
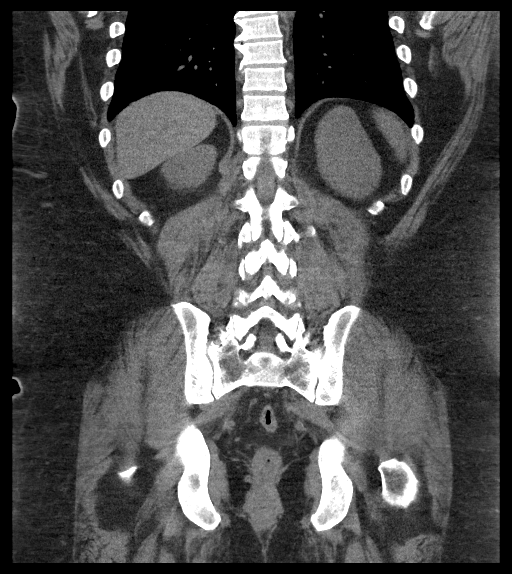

[Series 13: axial delay · axial · delayed · 0.98mm/px · z∈[+1037,+1232]mm · 2 of 117 slices shown]
[im 39/117  soft-tissue]
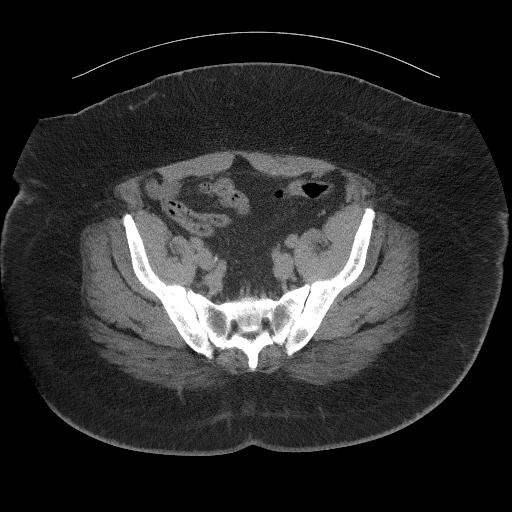
[im 78/117  soft-tissue]
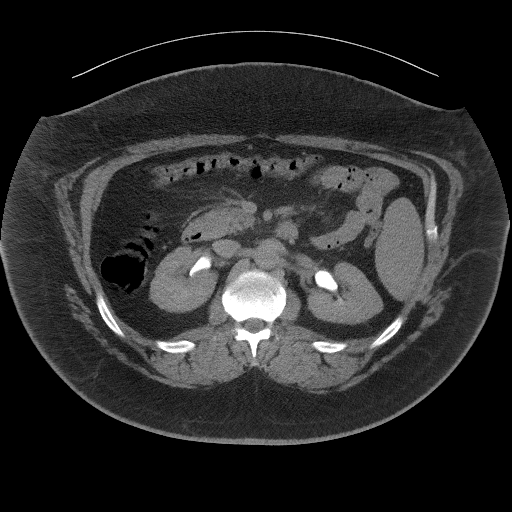

[Series 15: lung delay · axial · delayed · 0.98mm/px · z∈[+917,+1353]mm · 7 of 292 slices shown]
[im 37/292  bone]
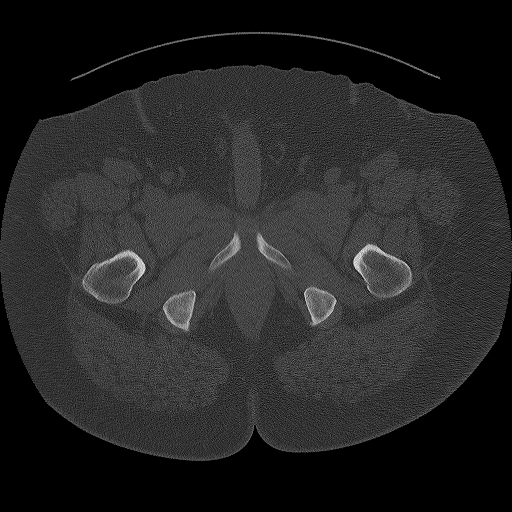
[im 73/292  bone]
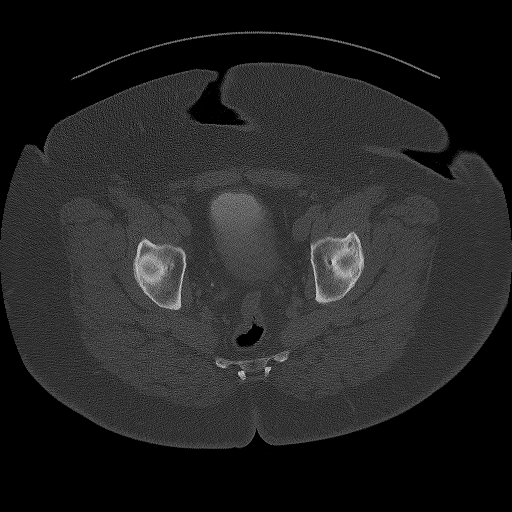
[im 110/292  bone]
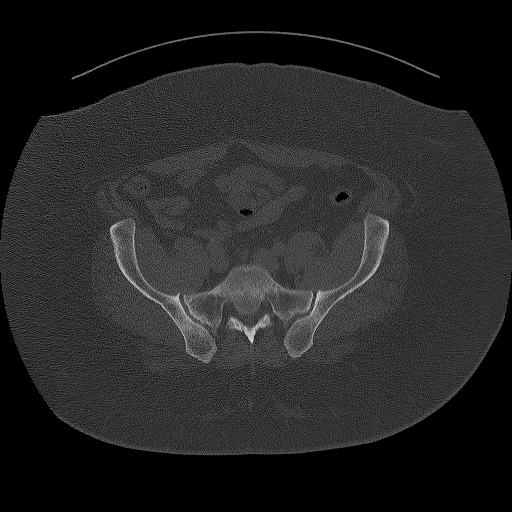
[im 146/292  bone]
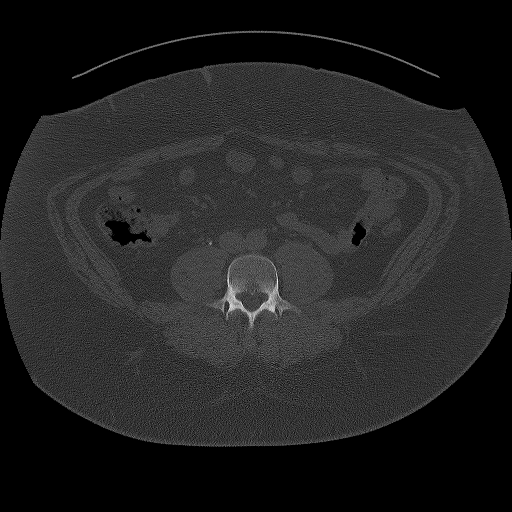
[im 182/292  bone]
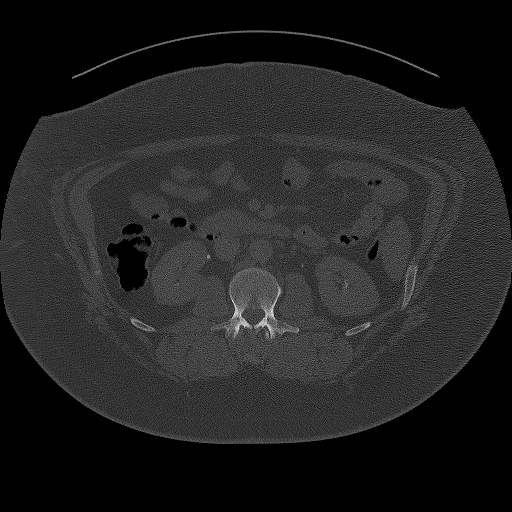
[im 219/292  bone]
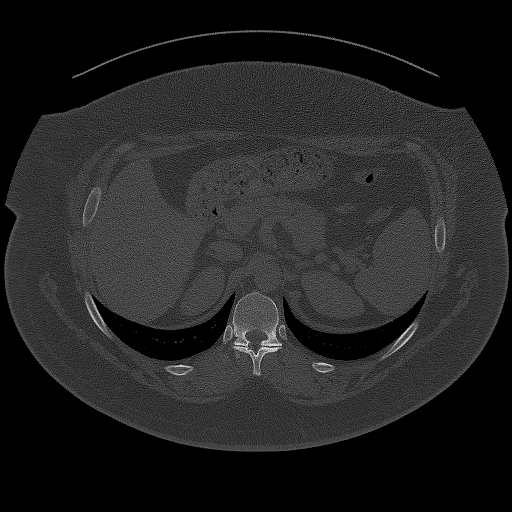
[im 255/292  bone]
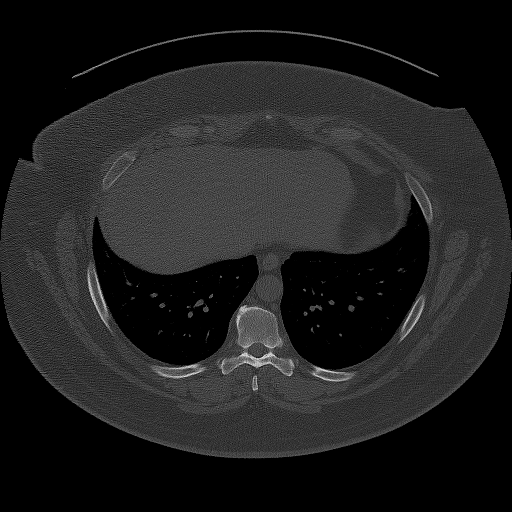

[13 of 46 positions shown; findings below may reference images not displayed]

RADIATION DOSE REDUCTION: This exam was performed according to the
departmental dose-optimization program which includes automated
exposure control, adjustment of the mA and/or kV according to
patient size and/or use of iterative reconstruction technique.

CONTRAST:  100mL OMNIPAQUE IOHEXOL 300 MG/ML  SOLN
FINDINGS: Lower chest: No acute abnormality.

Hepatobiliary: Hypodense 1 cm cyst in the posterior right lobe of
the liver. Cholelithiasis without findings of acute cholecystitis.
No biliary ductal dilation.

Pancreas: No pancreatic ductal dilation or evidence of acute
inflammation.

Spleen: Normal size without focal abnormality.

Adrenals/Urinary Tract: Bilateral adrenal glands appear normal.

No hydronephrosis. Two nonobstructive 5 mm right renal calculi. No
left renal calculi. No obstructive ureteral or bladder calculi.

No solid enhancing renal mass.

The kidneys demonstrate symmetric enhancement and excretion of
contrast material. No suspicious filling defect visualized within
the opacified portions of the collecting systems or ureters on
delayed imaging. However the left ureter is not well opacified
limiting its evaluation.

Evaluation of the urinary bladder is limited by underdistention and
urine contamination.

Stomach/Bowel: No enteric contrast was administered. Reflux versus
retained contents in the esophagus. Tiny hiatal hernia. Stomach is
minimally distended without wall thickening. No pathologic dilation
of small or large bowel. Terminal ileum appears normal. The appendix
is not confidently identified in its entirety however there is no
pericecal inflammation. No evidence of acute bowel inflammation.

Vascular/Lymphatic: Normal caliber abdominal aorta. No
pathologically enlarged abdominal or pelvic lymph nodes.

Reproductive: Prostate gland and seminal vesicles appear normal.

Other: No significant abdominopelvic free fluid.

Musculoskeletal: No acute or significant osseous findings.
IMPRESSION: 1. Two nonobstructive 5 mm right renal calculi.
2. No obstructive ureteral or bladder calculi. No hydronephrosis.
3. No solid enhancing renal mass.
4. Cholelithiasis without findings of acute cholecystitis.
5. Tiny hiatal hernia with reflux versus retained contents in the
esophagus.

## 2023-02-12 ENCOUNTER — Other Ambulatory Visit: Payer: Self-pay | Admitting: Family Medicine

## 2023-02-12 DIAGNOSIS — I10 Essential (primary) hypertension: Secondary | ICD-10-CM

## 2023-02-14 ENCOUNTER — Encounter: Payer: Self-pay | Admitting: Family Medicine

## 2023-02-14 ENCOUNTER — Ambulatory Visit (INDEPENDENT_AMBULATORY_CARE_PROVIDER_SITE_OTHER): Payer: BC Managed Care – PPO | Admitting: Family Medicine

## 2023-02-14 VITALS — BP 129/79 | HR 81 | Ht 74.0 in | Wt >= 6400 oz

## 2023-02-14 DIAGNOSIS — Z0001 Encounter for general adult medical examination with abnormal findings: Secondary | ICD-10-CM | POA: Diagnosis not present

## 2023-02-14 DIAGNOSIS — I10 Essential (primary) hypertension: Secondary | ICD-10-CM | POA: Diagnosis not present

## 2023-02-14 DIAGNOSIS — R400 Somnolence: Secondary | ICD-10-CM | POA: Diagnosis not present

## 2023-02-14 DIAGNOSIS — Z0000 Encounter for general adult medical examination without abnormal findings: Secondary | ICD-10-CM

## 2023-02-14 MED ORDER — LISINOPRIL-HYDROCHLOROTHIAZIDE 20-25 MG PO TABS: 0.5000 | ORAL_TABLET | Freq: Every day | ORAL | 3 refills | Status: DC

## 2023-02-14 NOTE — Progress Notes (Signed)
BP 129/79   Pulse 81   Ht 6' 2"$  (1.88 m)   Wt (!) 411 lb (186.4 kg)   SpO2 97%   BMI 52.77 kg/m    Subjective:   Patient ID: Tyler Wheeler, male    DOB: [birth year], 47 y.o.   MRN: [id number]  HPI: Tyler Wheeler is a 47 y.o. male presenting on 02/14/2023 for Medical Management of Chronic Issues (CPE) and Hypertension   HPI Physical exam Patient denies any chest pain, shortness of breath, headaches or vision issues, abdominal complaints, diarrhea, nausea, vomiting, or joint issues.  He had little bit of swelling in his legs and some plantar fasciitis at times any is working to improve that with new shoes and compression stockings.  Hypertension Patient is currently on lisinopril/hydrochlorothiazide, and their blood pressure today is 129/79. Patient denies any lightheadedness or dizziness. Patient denies headaches, blurred vision, chest pains, shortness of breath, or weakness. Denies any side effects from medication and is content with current medication.   Hyperlipidemia Patient is coming in for recheck of his hyperlipidemia. The patient is currently taking fish oil. They deny any issues with myalgias or history of liver damage from it. They deny any focal numbness or weakness or chest pain.   Relevant past medical, surgical, family and social history reviewed and updated as indicated. Interim medical history since our last visit reviewed. Allergies and medications reviewed and updated.  Review of Systems  Constitutional:  Positive for fatigue. Negative for chills and fever.  HENT:  Negative for ear pain and tinnitus.   Eyes:  Negative for pain.  Respiratory:  Negative for cough, shortness of breath and wheezing.   Cardiovascular:  Negative for chest pain, palpitations and leg swelling.  Gastrointestinal:  Negative for abdominal pain, blood in stool, constipation and diarrhea.  Genitourinary:  Negative for dysuria and hematuria.  Musculoskeletal:  Negative for back pain and  myalgias.  Skin:  Negative for rash.  Neurological:  Negative for dizziness, weakness and headaches.  Psychiatric/Behavioral:  Positive for sleep disturbance. Negative for decreased concentration, dysphoric mood and suicidal ideas.     Per HPI unless specifically indicated above   Allergies as of 02/14/2023       Reactions   No Known Allergies         Medication List        Accurate as of February 14, 2023  3:58 PM. If you have any questions, ask your nurse or doctor.          acetaminophen 500 MG tablet Commonly known as: TYLENOL Take 500 mg by mouth every 6 (six) hours as needed for mild pain or moderate pain.   ferrous sulfate 325 (65 FE) MG tablet Take 325 mg by mouth daily with breakfast.   Fish Oil 500 MG Caps Take 1 capsule by mouth daily.   ibuprofen 600 MG tablet Commonly known as: ADVIL Take 600 mg by mouth every 8 (eight) hours as needed for moderate pain or mild pain.   lisinopril-hydrochlorothiazide 20-25 MG tablet Commonly known as: ZESTORETIC Take 0.5 tablets by mouth daily. What changed: additional instructions Changed by: Fransisca Kaufmann Kamia Insalaco, MD   multivitamin with minerals tablet Take 1 tablet by mouth daily.         Objective:   BP 129/79   Pulse 81   Ht 6' 2"$  (1.88 m)   Wt (!) 411 lb (186.4 kg)   SpO2 97%   BMI 52.77 kg/m   Wt Readings from Last 3 Encounters:  02/14/23 (!) 411 lb (186.4 kg)  02/13/22 (!) 419 lb (190.1 kg)  01/04/22 (!) 422 lb (191.4 kg)    Physical Exam Vitals and nursing note reviewed.  Constitutional:      General: He is not in acute distress.    Appearance: He is well-developed. He is not diaphoretic.  Eyes:     General: No scleral icterus.    Conjunctiva/sclera: Conjunctivae normal.  Neck:     Thyroid: No thyromegaly.  Cardiovascular:     Rate and Rhythm: Normal rate and regular rhythm.     Heart sounds: Normal heart sounds. No murmur heard. Pulmonary:     Effort: Pulmonary effort is normal. No  respiratory distress.     Breath sounds: Normal breath sounds. No wheezing.  Musculoskeletal:        General: Swelling (1+ edema bilateral lower extremities) present. Normal range of motion.     Cervical back: Neck supple.  Lymphadenopathy:     Cervical: No cervical adenopathy.  Skin:    General: Skin is warm and dry.     Findings: No rash.  Neurological:     Mental Status: He is alert and oriented to person, place, and time.     Coordination: Coordination normal.  Psychiatric:        Behavior: Behavior normal.       Assessment & Plan:   Problem List Items Addressed This Visit       Cardiovascular and Mediastinum   Essential hypertension   Relevant Medications   lisinopril-hydrochlorothiazide (ZESTORETIC) 20-25 MG tablet   Other Visit Diagnoses     Well adult exam    -  Primary   Relevant Orders   CBC with Differential/Platelet   CMP14+EGFR   Lipid panel   PSA, total and free   TSH   Daytime somnolence       Relevant Orders   Ambulatory referral to Sleep Studies   TSH       Will refer for sleep studies because of daytime somnolence and risk for sleep apnea because obesity and family history.  Continue blood pressure medicine, no change. Follow up plan: Return in about 1 year (around 02/15/2024), or if symptoms worsen or fail to improve, for Physical exam and hypertension.  Counseling provided for all of the vaccine components Orders Placed This Encounter  Procedures   CBC with Differential/Platelet   CMP14+EGFR   Lipid panel   PSA, total and free   TSH   TSH   Ambulatory referral to Sleep Studies    Caryl Pina, MD [facility] 02/14/2023, 3:58 PM

## 2023-02-15 LAB — CBC WITH DIFFERENTIAL/PLATELET
Basophils Absolute: 0 10*3/uL (ref 0.0–0.2)
Basos: 0 %
EOS (ABSOLUTE): 0.1 10*3/uL (ref 0.0–0.4)
Eos: 2 %
Hematocrit: 36.8 % — ABNORMAL LOW (ref 37.5–51.0)
Hemoglobin: 12 g/dL — ABNORMAL LOW (ref 13.0–17.7)
Immature Grans (Abs): 0 10*3/uL (ref 0.0–0.1)
Immature Granulocytes: 0 %
Lymphocytes Absolute: 2.4 10*3/uL (ref 0.7–3.1)
Lymphs: 35 %
MCH: 29.1 pg (ref 26.6–33.0)
MCHC: 32.6 g/dL (ref 31.5–35.7)
MCV: 89 fL (ref 79–97)
Monocytes Absolute: 0.5 10*3/uL (ref 0.1–0.9)
Monocytes: 7 %
Neutrophils Absolute: 3.9 10*3/uL (ref 1.4–7.0)
Neutrophils: 56 %
Platelets: 243 10*3/uL (ref 150–450)
RBC: 4.12 x10E6/uL — ABNORMAL LOW (ref 4.14–5.80)
RDW: 14 % (ref 11.6–15.4)
WBC: 7 10*3/uL (ref 3.4–10.8)

## 2023-02-15 LAB — LIPID PANEL
Chol/HDL Ratio: 3.5 ratio (ref 0.0–5.0)
Cholesterol, Total: 130 mg/dL (ref 100–199)
HDL: 37 mg/dL — ABNORMAL LOW (ref >39)
LDL Chol Calc (NIH): 78 mg/dL (ref 0–99)
Triglycerides: 72 mg/dL (ref 0–149)
VLDL Cholesterol Cal: 15 mg/dL (ref 5–40)

## 2023-02-15 LAB — CMP14+EGFR
ALT: 29 IU/L (ref 0–44)
AST: 28 IU/L (ref 0–40)
Albumin/Globulin Ratio: 1.5 (ref 1.2–2.2)
Albumin: 4.1 g/dL (ref 4.1–5.1)
Alkaline Phosphatase: 60 IU/L (ref 44–121)
BUN/Creatinine Ratio: 11 (ref 9–20)
BUN: 10 mg/dL (ref 6–24)
Bilirubin Total: 0.2 mg/dL (ref 0.0–1.2)
CO2: 22 mmol/L (ref 20–29)
Calcium: 9 mg/dL (ref 8.7–10.2)
Chloride: 105 mmol/L (ref 96–106)
Creatinine, Ser: 0.95 mg/dL (ref 0.76–1.27)
Globulin, Total: 2.7 g/dL (ref 1.5–4.5)
Glucose: 99 mg/dL (ref 70–99)
Potassium: 4.3 mmol/L (ref 3.5–5.2)
Sodium: 141 mmol/L (ref 134–144)
Total Protein: 6.8 g/dL (ref 6.0–8.5)
eGFR: 101 mL/min/{1.73_m2} (ref >59)

## 2023-02-15 LAB — PSA, TOTAL AND FREE
PSA, Free Pct: 20 %
PSA, Free: 0.12 ng/mL
Prostate Specific Ag, Serum: 0.6 ng/mL (ref 0.0–4.0)

## 2023-02-15 LAB — TSH: TSH: 1.26 u[IU]/mL (ref 0.450–4.500)

## 2024-02-16 ENCOUNTER — Ambulatory Visit (INDEPENDENT_AMBULATORY_CARE_PROVIDER_SITE_OTHER): Payer: BC Managed Care – PPO | Admitting: Family Medicine

## 2024-02-16 ENCOUNTER — Encounter: Payer: Self-pay | Admitting: Family Medicine

## 2024-02-16 VITALS — BP 97/61 | HR 70 | Ht 74.0 in | Wt >= 6400 oz

## 2024-02-16 DIAGNOSIS — Z0000 Encounter for general adult medical examination without abnormal findings: Secondary | ICD-10-CM

## 2024-02-16 DIAGNOSIS — Z0001 Encounter for general adult medical examination with abnormal findings: Secondary | ICD-10-CM

## 2024-02-16 DIAGNOSIS — I10 Essential (primary) hypertension: Secondary | ICD-10-CM | POA: Diagnosis not present

## 2024-02-16 NOTE — Progress Notes (Signed)
 BP 97/61   Pulse 70   Ht 6\' 2"  (1.88 m)   Wt (!) 410 lb (186 kg)   SpO2 96%   BMI 52.64 kg/m    Subjective:   Patient ID: Tyler Wheeler, male    DOB: 1976/02/23, 48 y.o.   MRN: [id number]  HPI: Tyler Wheeler is a 48 y.o. male presenting on 02/16/2024 for Medical Management of Chronic Issues (CPE)   HPI Physical Exam Patient denies any chest pain, shortness of breath, headaches or vision issues, abdominal complaints, diarrhea, nausea, vomiting, or joint issues.   Hypertension Patient is currently on lisinopril hydrochlorothiazide, and their blood pressure today is 97/61. Patient denies any lightheadedness or dizziness. Patient denies headaches, blurred vision, chest pains, shortness of breath, or weakness. Denies any side effects from medication and is content with current medication.   Relevant past medical, surgical, family and social history reviewed and updated as indicated. Interim medical history since our last visit reviewed. Allergies and medications reviewed and updated.  Review of Systems  Constitutional:  Negative for chills and fever.  HENT:  Negative for ear pain and tinnitus.   Eyes:  Negative for pain.  Respiratory:  Negative for cough, shortness of breath and wheezing.   Cardiovascular:  Negative for chest pain, palpitations and leg swelling.  Gastrointestinal:  Negative for abdominal pain, blood in stool, constipation and diarrhea.  Genitourinary:  Negative for dysuria and hematuria.  Musculoskeletal:  Negative for back pain and myalgias.  Skin:  Negative for rash.  Neurological:  Negative for dizziness, weakness and headaches.  Psychiatric/Behavioral:  Negative for suicidal ideas.     Per HPI unless specifically indicated above   Allergies as of 02/16/2024       Reactions   No Known Allergies         Medication List        Accurate as of February 16, 2024  3:56 PM. If you have any questions, ask your nurse or doctor.          STOP taking  these medications    acetaminophen 500 MG tablet Commonly known as: TYLENOL Stopped by: Elige Radon Azlyn Wingler   ferrous sulfate 325 (65 FE) MG tablet Stopped by: Elige Radon Estoria Geary   lisinopril-hydrochlorothiazide 20-25 MG tablet Commonly known as: ZESTORETIC Stopped by: Elige Radon Selim Durden       TAKE these medications    Fish Oil 500 MG Caps Take 1 capsule by mouth daily.   ibuprofen 600 MG tablet Commonly known as: ADVIL Take 600 mg by mouth every 8 (eight) hours as needed for moderate pain or mild pain.   multivitamin with minerals tablet Take 1 tablet by mouth daily.         Objective:   BP 97/61   Pulse 70   Ht 6\' 2"  (1.88 m)   Wt (!) 410 lb (186 kg)   SpO2 96%   BMI 52.64 kg/m   Wt Readings from Last 3 Encounters:  02/16/24 (!) 410 lb (186 kg)  02/14/23 (!) 411 lb (186.4 kg)  02/13/22 (!) 419 lb (190.1 kg)    Physical Exam Vitals reviewed.  Constitutional:      General: He is not in acute distress.    Appearance: He is well-developed. He is not diaphoretic.  HENT:     Right Ear: External ear normal.     Left Ear: External ear normal.     Nose: Nose normal.     Mouth/Throat:     Pharynx: No  oropharyngeal exudate.  Eyes:     General: No scleral icterus.       Right eye: No discharge.     Conjunctiva/sclera: Conjunctivae normal.     Pupils: Pupils are equal, round, and reactive to light.  Neck:     Thyroid: No thyromegaly.  Cardiovascular:     Rate and Rhythm: Normal rate and regular rhythm.     Heart sounds: Normal heart sounds. No murmur heard. Pulmonary:     Effort: Pulmonary effort is normal. No respiratory distress.     Breath sounds: Normal breath sounds. No wheezing.  Abdominal:     General: Bowel sounds are normal. There is no distension.     Palpations: Abdomen is soft.     Tenderness: There is no abdominal tenderness. There is no guarding or rebound.  Musculoskeletal:        General: Swelling (1+ edema bilateral lower extremity)  present. Normal range of motion.     Cervical back: Neck supple.  Lymphadenopathy:     Cervical: No cervical adenopathy.  Skin:    General: Skin is warm and dry.     Findings: No rash.  Neurological:     Mental Status: He is alert and oriented to person, place, and time.     Coordination: Coordination normal.  Psychiatric:        Behavior: Behavior normal.       Assessment & Plan:   Problem List Items Addressed This Visit       Cardiovascular and Mediastinum   Essential hypertension   Relevant Orders   CMP14+EGFR   Lipid panel   Other Visit Diagnoses       Physical exam    -  Primary   Relevant Orders   CBC with Differential/Platelet   CMP14+EGFR   Lipid panel     Will check blood work today.  Will have him stop taking his blood pressure pill and monitor his blood pressure over the next couple weeks.  Follow up plan: Return in about 1 year (around 02/15/2025), or if symptoms worsen or fail to improve, for Physical exam.  Counseling provided for all of the vaccine components Orders Placed This Encounter  Procedures   CBC with Differential/Platelet   CMP14+EGFR   Lipid panel    Arville Care, MD [facility] 02/16/2024, 3:56 PM

## 2024-02-17 LAB — CBC WITH DIFFERENTIAL/PLATELET
Basophils Absolute: 0 10*3/uL (ref 0.0–0.2)
Basos: 0 %
EOS (ABSOLUTE): 0.2 10*3/uL (ref 0.0–0.4)
Eos: 3 %
Hematocrit: 38.8 % (ref 37.5–51.0)
Hemoglobin: 12.8 g/dL — ABNORMAL LOW (ref 13.0–17.7)
Immature Grans (Abs): 0 10*3/uL (ref 0.0–0.1)
Immature Granulocytes: 1 %
Lymphocytes Absolute: 2.8 10*3/uL (ref 0.7–3.1)
Lymphs: 35 %
MCH: 30.8 pg (ref 26.6–33.0)
MCHC: 33 g/dL (ref 31.5–35.7)
MCV: 93 fL (ref 79–97)
Monocytes Absolute: 0.5 10*3/uL (ref 0.1–0.9)
Monocytes: 6 %
Neutrophils Absolute: 4.4 10*3/uL (ref 1.4–7.0)
Neutrophils: 55 %
Platelets: 273 10*3/uL (ref 150–450)
RBC: 4.16 x10E6/uL (ref 4.14–5.80)
RDW: 13.2 % (ref 11.6–15.4)
WBC: 7.9 10*3/uL (ref 3.4–10.8)

## 2024-02-17 LAB — CMP14+EGFR
ALT: 34 [IU]/L (ref 0–44)
AST: 25 [IU]/L (ref 0–40)
Albumin: 4.2 g/dL (ref 4.1–5.1)
Alkaline Phosphatase: 58 [IU]/L (ref 44–121)
BUN/Creatinine Ratio: 11 (ref 9–20)
BUN: 10 mg/dL (ref 6–24)
Bilirubin Total: 0.3 mg/dL (ref 0.0–1.2)
CO2: 24 mmol/L (ref 20–29)
Calcium: 9.1 mg/dL (ref 8.7–10.2)
Chloride: 103 mmol/L (ref 96–106)
Creatinine, Ser: 0.87 mg/dL (ref 0.76–1.27)
Globulin, Total: 3 g/dL (ref 1.5–4.5)
Glucose: 101 mg/dL — ABNORMAL HIGH (ref 70–99)
Potassium: 4.3 mmol/L (ref 3.5–5.2)
Sodium: 142 mmol/L (ref 134–144)
Total Protein: 7.2 g/dL (ref 6.0–8.5)
eGFR: 108 mL/min/{1.73_m2} (ref >59)

## 2024-02-17 LAB — LIPID PANEL
Chol/HDL Ratio: 3.1 {ratio} (ref 0.0–5.0)
Cholesterol, Total: 124 mg/dL (ref 100–199)
HDL: 40 mg/dL (ref >39)
LDL Chol Calc (NIH): 67 mg/dL (ref 0–99)
Triglycerides: 86 mg/dL (ref 0–149)
VLDL Cholesterol Cal: 17 mg/dL (ref 5–40)

## 2024-02-20 ENCOUNTER — Encounter: Payer: Self-pay | Admitting: Family Medicine

## 2024-02-27 ENCOUNTER — Other Ambulatory Visit: Payer: Self-pay | Admitting: Family Medicine

## 2024-02-27 DIAGNOSIS — I878 Other specified disorders of veins: Secondary | ICD-10-CM

## 2024-02-27 DIAGNOSIS — R600 Localized edema: Secondary | ICD-10-CM

## 2024-02-27 NOTE — Progress Notes (Signed)
Placed order for compression stockings.

## 2024-08-18 ENCOUNTER — Telehealth: Payer: Self-pay

## 2024-08-18 NOTE — Telephone Encounter (Signed)
 Copied from CRM [id number]. Topic: Referral - Request for Referral >> Aug 18, 2024  3:17 PM Rosaria BRAVO wrote: Did the patient discuss referral with their provider in the last year? Yes (If No - schedule appointment) (If Yes - send message)  Appointment offered? No  Type of order/referral and detailed reason for visit: Vein specialist. Pt has varicose veins, needs a referral from PCP to be seen.     Preference of office, provider, location: [facility]  If referral order, have you been seen by this specialty before? No (If Yes, this issue or another issue? When? Where?  Can we respond through MyChart? Yes

## 2024-08-23 NOTE — Telephone Encounter (Signed)
 Appt made for 9/12 at 8:55am. Pt made aware.

## 2024-08-23 NOTE — Telephone Encounter (Signed)
 Unfortunately there is no documentation of the varicose veins this year, we have to documented for referral to be covered by insurance so we will have to either discuss it at his next visit or he can come in sooner if he feels like it is urgent.

## 2024-09-10 ENCOUNTER — Encounter: Payer: Self-pay | Admitting: Family Medicine

## 2024-09-10 ENCOUNTER — Ambulatory Visit: Admitting: Family Medicine

## 2024-09-10 VITALS — BP 122/83 | HR 72 | Temp 98.1°F | Ht 74.0 in | Wt 395.0 lb

## 2024-09-10 DIAGNOSIS — R600 Localized edema: Secondary | ICD-10-CM

## 2024-09-10 DIAGNOSIS — I83893 Varicose veins of bilateral lower extremities with other complications: Secondary | ICD-10-CM

## 2024-09-10 MED ORDER — FUROSEMIDE 20 MG PO TABS: 20.0000 mg | ORAL_TABLET | Freq: Every day | ORAL | 3 refills | Status: DC

## 2024-09-10 NOTE — Progress Notes (Signed)
 BP 122/83   Pulse 72   Temp 98.1 F (36.7 C)   Ht 6' 2 (1.88 m)   Wt (!) 395 lb (179.2 kg)   SpO2 99%   BMI 50.71 kg/m    Subjective:   Patient ID: Tyler Wheeler, male    DOB: 1976-01-12, 48 y.o.   MRN: [id number]  HPI: Tyler Wheeler is a 48 y.o. male presenting on 09/10/2024 for Skin Discoloration (BLE) and Varicose Veins (BLE)   Discussed the use of AI scribe software for clinical note transcription with the patient, who gave verbal consent to proceed.  History of Present Illness   Tyler Wheeler is a 48 year old male who presents with worsening varicose veins and leg swelling.  He has worsening varicose veins, which have become more prominent and occasionally sore. He wears compression stockings regularly, which provide some relief but do not completely prevent the formation of new varicose veins. He has a family history of varicose veins, as his grandmother also had them.  He experiences persistent leg swelling, which does not fully resolve even with leg elevation or compression stockings. The swelling sometimes forms in specific spots around the sides of his legs. He works long hours, now twelve-hour shifts, often on hard surfaces, which may contribute to the swelling. He has previously used a diuretic, which provided some relief from the swelling.  His plantar fasciitis flares up more when standing still for long periods compared to walking. He tries to move around as much as possible during his shifts to alleviate symptoms.  He has been actively trying to lose weight since April and has lost over twenty pounds, starting from 417 pounds. He believes that weight loss may help improve his overall health and reduce joint pain.          Relevant past medical, surgical, family and social history reviewed and updated as indicated. Interim medical history since our last visit reviewed. Allergies and medications reviewed and updated.  Review of Systems  Constitutional:  Negative  for chills and fever.  Respiratory:  Negative for shortness of breath and wheezing.   Cardiovascular:  Positive for leg swelling. Negative for chest pain.  Skin:  Positive for color change. Negative for rash.  All other systems reviewed and are negative.   Per HPI unless specifically indicated above   Allergies as of 09/10/2024       Reactions   No Known Allergies         Medication List        Accurate as of September 10, 2024  9:13 AM. If you have any questions, ask your nurse or doctor.          Fish Oil 500 MG Caps Take 1 capsule by mouth daily.   furosemide  20 MG tablet Commonly known as: LASIX  Take 1 tablet (20 mg total) by mouth daily. Started by: Fonda LABOR Rodneisha Bonnet   ibuprofen 600 MG tablet Commonly known as: ADVIL Take 600 mg by mouth every 8 (eight) hours as needed for moderate pain or mild pain.   multivitamin with minerals tablet Take 1 tablet by mouth daily.   NON FORMULARY Take 1 Dose by mouth daily. Magnesium with glycinate and malate         Objective:   BP 122/83   Pulse 72   Temp 98.1 F (36.7 C)   Ht 6' 2 (1.88 m)   Wt (!) 395 lb (179.2 kg)   SpO2 99%   BMI 50.71 kg/m  Wt Readings from Last 3 Encounters:  09/10/24 (!) 395 lb (179.2 kg)  02/16/24 (!) 410 lb (186 kg)  02/14/23 (!) 411 lb (186.4 kg)    Physical Exam Vitals and nursing note reviewed.  Constitutional:      Appearance: Normal appearance.  Musculoskeletal:        General: Swelling (1+ edema bilateral lower extremities) present.     Comments: Multiple inflamed varicose veins on both lower extremities  Neurological:     Mental Status: He is alert.                 Assessment & Plan:   Problem List Items Addressed This Visit   None Visit Diagnoses       Varicose veins of both legs with edema    -  Primary   Relevant Medications   furosemide  (LASIX ) 20 MG tablet   Other Relevant Orders   Ambulatory referral to Vascular Surgery     Peripheral edema        Relevant Medications   furosemide  (LASIX ) 20 MG tablet   Other Relevant Orders   Ambulatory referral to Vascular Surgery          Chronic venous insufficiency with varicose veins of lower extremities Worsening venous insufficiency with intermittent soreness and swelling. Compression stockings partially effective. Genetic predisposition likely. - Prescribe diuretics for symptomatic days as needed. - Refer to vein and vascular specialist at [facility] in [place] for further evaluation and potential intervention. - Advise continued use of compression stockings. - Encourage movement and leg elevation.  Obesity Obesity with recent weight loss over 20 pounds since April. Efforts focus on health improvement and joint pain reduction. - Encourage continued weight loss efforts to improve health and reduce joint pain.          Follow up plan: Return if symptoms worsen or fail to improve.  Counseling provided for all of the vaccine components Orders Placed This Encounter  Procedures   Ambulatory referral to Vascular Surgery    Fonda Levins, MD [facility] 09/10/2024, 9:13 AM

## 2024-09-16 ENCOUNTER — Telehealth: Payer: Self-pay

## 2024-09-16 NOTE — Telephone Encounter (Signed)
 Copied from CRM #[id number]. Topic: Clinical - Lab/Test Results >> Sep 16, 2024  3:33 PM Willma R wrote: Reason for CRM: Patient had an Ultra sounds done of his leg today at [facility]. States the results are supposed to be sent to Dr Dettinger. Patient wondering if they have been received.   Patient can be reached at [phone]

## 2024-09-16 NOTE — Telephone Encounter (Signed)
 I have not yet received it, we can request sent, sometimes things from [facility] take more time to get to us  because they do not have the same computer system that we have, I will watch for

## 2024-09-17 ENCOUNTER — Telehealth: Payer: Self-pay

## 2024-09-17 NOTE — Telephone Encounter (Signed)
 Patient aware and verbalized understanding.

## 2024-09-17 NOTE — Telephone Encounter (Signed)
 Duplicate call see other encounter Dettinger has already responded

## 2024-09-17 NOTE — Telephone Encounter (Signed)
 Copied from CRM #[id number]. Topic: Clinical - Lab/Test Results >> Sep 16, 2024  3:33 PM Willma R wrote: Reason for CRM: Patient had an Ultra sounds done of his leg today at [facility]. States the results are supposed to be sent to Dr Dettinger. Patient wondering if they have been received.   Patient can be reached at [phone] >> Sep 17, 2024  1:30 PM Mia F wrote: Pt is calling back to check the status of the recent US  he had done. Pt is asking if the US  results were sent yet. It involves a blood clot in his leg. PT is waiting for results

## 2024-09-21 ENCOUNTER — Ambulatory Visit: Payer: Self-pay

## 2024-09-21 ENCOUNTER — Telehealth: Payer: Self-pay | Admitting: Family Medicine

## 2024-09-21 ENCOUNTER — Ambulatory Visit: Admitting: Nurse Practitioner

## 2024-09-21 ENCOUNTER — Encounter: Payer: Self-pay | Admitting: Nurse Practitioner

## 2024-09-21 VITALS — BP 126/75 | HR 81 | Temp 98.4°F | Ht 74.0 in | Wt 398.2 lb

## 2024-09-21 DIAGNOSIS — S7012XS Contusion of left thigh, sequela: Secondary | ICD-10-CM | POA: Diagnosis not present

## 2024-09-21 DIAGNOSIS — M25562 Pain in left knee: Secondary | ICD-10-CM | POA: Diagnosis not present

## 2024-09-21 DIAGNOSIS — M25862 Other specified joint disorders, left knee: Secondary | ICD-10-CM | POA: Diagnosis not present

## 2024-09-21 NOTE — Telephone Encounter (Signed)
 Pt has appt

## 2024-09-21 NOTE — Progress Notes (Signed)
 Subjective:  Patient ID: Tyler Wheeler, male    DOB: 04/28/1976, 48 y.o.   MRN: [id number]  Patient Care Team: Dettinger, Fonda LABOR, MD as PCP - General (Family Medicine)   Chief Complaint:  Mass (Lump on back of left knee noticed today)   HPI: Tyler Wheeler is a 48 y.o. male presenting on 09/21/2024 for Mass (Lump on back of left knee noticed today)   Discussed the use of AI scribe software for clinical note transcription with the patient, who gave verbal consent to proceed.  History of Present Illness Tyler Wheeler is a 48 year old male who presents with a new spot on the back of his left knee.  He noticed a new spot on the back of his left knee, above the bend, earlier today. The spot causes dull pain when bending his knee or foot inward. He took Tylenol  for relief, which provided some comfort.  Last Wednesday, he visited the emergency room due to a large bruise on the inside of his thigh, which was the size of his hand and accompanied by cramping. An ultrasound was performed last Thursday, but he has not received the results yet despite attempts to obtain the information from the hospital.  He works at [employer], where he is active, walking across the building and shooting guns. He has not experienced shortness of breath or significant pain while walking, except for the dull pain when bending his knee.      Relevant past medical, surgical, family, and social history reviewed and updated as indicated.  Allergies and medications reviewed and updated. Data reviewed: Chart in Epic.   Past Medical History:  Diagnosis Date   Dyslexia    History of kidney stones    passed stones   Hypertension     Past Surgical History:  Procedure Laterality Date   APPENDECTOMY     KNEE ARTHROSCOPY WITH MEDIAL MENISECTOMY Right 07/10/2021   Procedure: KNEE ARTHROSCOPY WITH PARTIAL  MEDIAL MENISECTOMY;  Surgeon: Sharl Selinda Dover, MD;  Location: [facility] OR;  Service: Orthopedics;  Laterality: Right;     KNEE SURGERY     Right   RETROPERITONEAL LYMPH NODE EXCISION     Right side   SHOULDER SURGERY     Right    Social History   Socioeconomic History   Marital status: Married    Spouse name: Not on file   Number of children: Not on file   Years of education: Not on file   Highest education level: Associate degree: occupational, Scientist, product/process development, or vocational program  Occupational History   Occupation: [employer]  Tobacco Use   Smoking status: Never   Smokeless tobacco: Former    Types: Snuff    Quit date: 2009  Vaping Use   Vaping status: Never Used  Substance and Sexual Activity   Alcohol use: No   Drug use: No   Sexual activity: Not on file  Other Topics Concern   Not on file  Social History Narrative   Married   No regular exercise   Social Drivers of Health   Financial Resource Strain: Low Risk  (09/09/2024)   Overall Financial Resource Strain (CARDIA)    Difficulty of Paying Living Expenses: Not hard at all  Food Insecurity: No Food Insecurity (09/09/2024)   Hunger Vital Sign    Worried About Running Out of Food in the Last Year: Never true    Ran Out of Food in the Last Year: Never true  Transportation Needs: No  Transportation Needs (09/09/2024)   PRAPARE - Administrator, Civil Service (Medical): No    Lack of Transportation (Non-Medical): No  Physical Activity: Insufficiently Active (09/09/2024)   Exercise Vital Sign    Days of Exercise per Week: 5 days    Minutes of Exercise per Session: 10 min  Stress: No Stress Concern Present (09/09/2024)   [employer] of Occupational Health - Occupational Stress Questionnaire    Feeling of Stress: Only a little  Social Connections: Socially Integrated (09/09/2024)   Social Connection and Isolation Panel    Frequency of Communication with Friends and Family: More than three times a week    Frequency of Social Gatherings with Friends and Family: Once a week    Attends Religious Services: More than 4 times  per year    Active Member of [employer] or Organizations: Yes    Attends Banker Meetings: More than 4 times per year    Marital Status: Married  Catering manager Violence: Not on file    Outpatient Encounter Medications as of 09/21/2024  Medication Sig   furosemide  (LASIX ) 20 MG tablet Take 1 tablet (20 mg total) by mouth daily.   ibuprofen (ADVIL) 600 MG tablet Take 600 mg by mouth every 8 (eight) hours as needed for moderate pain or mild pain.   Multiple Vitamins-Minerals (MULTIVITAMIN WITH MINERALS) tablet Take 1 tablet by mouth daily.   NON FORMULARY Take 1 Dose by mouth daily. Magnesium with glycinate and malate   Omega-3 Fatty Acids (FISH OIL) 500 MG CAPS Take 1 capsule by mouth daily.   No facility-administered encounter medications on file as of 09/21/2024.    Allergies  Allergen Reactions   No Known Allergies     Pertinent ROS per HPI, otherwise unremarkable      Objective:  BP 126/75   Pulse 81   Temp 98.4 F (36.9 C) (Temporal)   Ht 6' 2 (1.88 m)   Wt (!) 398 lb 3.2 oz (180.6 kg)   SpO2 97%   BMI 51.13 kg/m    Wt Readings from Last 3 Encounters:  09/21/24 (!) 398 lb 3.2 oz (180.6 kg)  09/10/24 (!) 395 lb (179.2 kg)  02/16/24 (!) 410 lb (186 kg)    Physical Exam Vitals and nursing note reviewed.  Constitutional:      Appearance: He is obese.  HENT:     Head: Normocephalic and atraumatic.     Nose: Nose normal.     Mouth/Throat:     Mouth: Mucous membranes are moist.  Eyes:     General: No scleral icterus.    Extraocular Movements: Extraocular movements intact.     Conjunctiva/sclera: Conjunctivae normal.     Pupils: Pupils are equal, round, and reactive to light.  Cardiovascular:     Heart sounds: Normal heart sounds.  Pulmonary:     Effort: Pulmonary effort is normal.     Breath sounds: Normal breath sounds.  Musculoskeletal:     Right knee: Normal.     Left knee: No bony tenderness. Normal range of motion. Tenderness present.        Legs:  Skin:    General: Skin is warm and dry.     Findings: Ecchymosis present.      Neurological:     General: No focal deficit present.     Mental Status: He is alert and oriented to person, place, and time.     Gait: Gait is intact.  Psychiatric:  Mood and Affect: Mood normal.        Behavior: Behavior normal.        Thought Content: Thought content normal.    Physical Exam EXTREMITIES: Tender cyst in the back of the left knee. No warmth in the left leg.     Results for orders placed or performed in visit on 02/16/24  CBC with Differential/Platelet   Collection Time: 02/16/24  4:03 PM  Result Value Ref Range   WBC 7.9 3.4 - 10.8 x10E3/uL   RBC 4.16 4.14 - 5.80 x10E6/uL   Hemoglobin 12.8 (L) 13.0 - 17.7 g/dL   Hematocrit 61.1 62.4 - 51.0 %   MCV 93 79 - 97 fL   MCH 30.8 26.6 - 33.0 pg   MCHC 33.0 31.5 - 35.7 g/dL   RDW 86.7 88.3 - 84.5 %   Platelets 273 150 - 450 x10E3/uL   Neutrophils 55 Not Estab. %   Lymphs 35 Not Estab. %   Monocytes 6 Not Estab. %   Eos 3 Not Estab. %   Basos 0 Not Estab. %   Neutrophils Absolute 4.4 1.4 - 7.0 x10E3/uL   Lymphocytes Absolute 2.8 0.7 - 3.1 x10E3/uL   Monocytes Absolute 0.5 0.1 - 0.9 x10E3/uL   EOS (ABSOLUTE) 0.2 0.0 - 0.4 x10E3/uL   Basophils Absolute 0.0 0.0 - 0.2 x10E3/uL   Immature Granulocytes 1 Not Estab. %   Immature Grans (Abs) 0.0 0.0 - 0.1 x10E3/uL  CMP14+EGFR   Collection Time: 02/16/24  4:03 PM  Result Value Ref Range   Glucose 101 (H) 70 - 99 mg/dL   BUN 10 6 - 24 mg/dL   Creatinine, Ser 9.12 0.76 - 1.27 mg/dL   eGFR 891 >40 fO/fpw/8.26   BUN/Creatinine Ratio 11 9 - 20   Sodium 142 134 - 144 mmol/L   Potassium 4.3 3.5 - 5.2 mmol/L   Chloride 103 96 - 106 mmol/L   CO2 24 20 - 29 mmol/L   Calcium 9.1 8.7 - 10.2 mg/dL   Total Protein 7.2 6.0 - 8.5 g/dL   Albumin 4.2 4.1 - 5.1 g/dL   Globulin, Total 3.0 1.5 - 4.5 g/dL   Bilirubin Total 0.3 0.0 - 1.2 mg/dL   Alkaline Phosphatase 58 44 - 121 IU/L    AST 25 0 - 40 IU/L   ALT 34 0 - 44 IU/L  Lipid panel   Collection Time: 02/16/24  4:03 PM  Result Value Ref Range   Cholesterol, Total 124 100 - 199 mg/dL   Triglycerides 86 0 - 149 mg/dL   HDL 40 >60 mg/dL   VLDL Cholesterol Cal 17 5 - 40 mg/dL   LDL Chol Calc (NIH) 67 0 - 99 mg/dL   Chol/HDL Ratio 3.1 0.0 - 5.0 ratio       Pertinent labs & imaging results that were available during my care of the patient were reviewed by me and considered in my medical decision making.  Assessment & Plan:  Shade was seen today for mass.  Diagnoses and all orders for this visit:  Acute pain of left knee  Knee joint cyst, left  Traumatic ecchymosis of thigh, left, sequela    Chest 48 year old Caucasian male seen today for left knee cyst, no acute distress Assessment and Plan Assessment & Plan Cyst of left popliteal fossa Tender cyst in left popliteal fossa with dull pain on knee or foot bending. No significant symptoms warranting aspiration. - Continue NSAIDs for pain management. - Apply warm compresses for  15-20 minutes, avoid sleeping with heat pad. - Wear compression socks over knee. - Elevate leg when sitting.  Ecchymosis of left thigh, improving Ecchymosis on left thigh improving, awaiting ultrasound results. - Ensure ultrasound results sent to primary care provider. - Monitor for symptom changes or worsening.      Continue all other maintenance medications.  Follow up plan: Return if symptoms worsen or fail to improve.   Continue healthy lifestyle choices, including diet (rich in fruits, vegetables, and lean proteins, and low in salt and simple carbohydrates) and exercise (at least 30 minutes of moderate physical activity daily).  Educational handout given for  Joint Pain  Joint pain can be caused by many things. It may go away if you follow instructions from your health care provider for taking care of yourself at home. Sometimes, you may need more treatment. Joint  pain can be caused by: Bruises at the area of the joint. An injury caused by movements that are repeated. Wear and tear on the joint as you get older. Buildup of uric acid crystals in the joint. This is also called gout. Irritation and swelling of the joint. Types of arthritis. Infections of the joint or of the bone. Your provider may tell you to take pain medicine or wear an elastic bandage, sling, or splint. If your joint pain continues, you may need lab or imaging tests to find the cause of your joint pain. Follow these instructions at home: If you have an elastic bandage, sling, or splint that can be taken off: Wear the bandage, sling, or splint as told by your provider. Take it off only if your provider says you can. Check the skin under and around it every day. Tell your provider if you see problems. Loosen it if your fingers or toes tingle, are numb, or turn cold and blue. Keep it clean and dry. Ask your provider if you should remove it before bathing. If the bandage, sling, or splint is not waterproof: Do not let it get wet. Cover it when you take a bath or shower. Use a cover that does not let any water in. Managing pain, stiffness, and swelling     If told, put ice on the area. If you have an elastic bandage, sling, or splint that you can take off, remove it as told. Put ice in a plastic bag. Place a towel between your skin and the bag. Leave the ice on for 20 minutes, 2-3 times a day. If told, put heat on the area. Do this as often as told. Use the heat source that your provider recommends, such as a moist heat pack or a heating pad. Place a towel between your skin and the heat source. Leave the heat on for 20-30 minutes. If your skin turns bright red, take off the ice or heat right away to prevent skin damage. The risk of damage is higher if you can't feel pain, heat, or cold. Move your fingers or toes often to reduce stiffness and swelling. Raise the injured area above  the level of your heart while you're sitting or lying down. Use a pillow to support the painful area as needed. Activity Rest the painful joint as told. Do not do things that cause pain or make pain worse. Begin exercising or stretching the affected area as told by your provider. Return to normal activities when you are told. Ask what things are safe for you to do. General instructions Take your medicines as told by your provider.  Treatment may include medicines for pain and swelling that are taken by mouth or applied to the skin. Do not smoke, vape, or use products with nicotine or tobacco in them. If you need help quitting, talk with your provider. Keep all follow-up visits. Your provider will want to check on your condition. Contact a health care provider if: You have pain that does not get better with medicine. Your joint pain does not improve within 3 days. You have more bruising or swelling. You have a fever. You lose 10 lb (4.5 kg) or more without trying. Get help right away if: You cannot move the joint. Your fingers or toes tingle, become numb, or turn cold and blue. You have a fever along with a joint that's red, warm, and swollen. This information is not intended to replace advice given to you by your health care provider. Make sure you discuss any questions you have with your health care provider. Document Revised: 09/18/2023 Document Reviewed: 02/28/2023 Elsevier Patient Education  2024 Elsevier Inc. Contusion A contusion is a deep bruise. This is a result of an injury that causes bleeding under the skin. Symptoms of bruising include pain, swelling, and discolored skin. The skin may turn blue, purple, or yellow. Follow these instructions at home: Managing pain, stiffness, and swelling You may use RICE. This stands for: Resting. Icing. Compression, or putting pressure on the injured area. Elevating, or raising the injured area. To follow this method, do these  actions: Rest the injured area. If told, put ice on the injured area. To do this: Put ice in a plastic bag. Place a towel between your skin and the bag. Leave the ice on for 20 minutes, 2-3 times per day. If your skin turns bright red, take off the ice right away to prevent skin damage. The risk of skin damage is higher if you cannot feel pain, heat, or cold. If told, apply compression on the injured area using an elastic bandage. Make sure the bandage is not too tight. If the area tingles or has a loss of feeling (numbness), remove it and put it back on as told by your doctor. If possible, elevate the injured area above the level of your heart while you are sitting or lying down.   General instructions Take over-the-counter and prescription medicines only as told by your doctor. Keep all follow-up visits. Your doctor may want to see how your contusion is healing with treatment. Contact a doctor if: Your symptoms do not get better after several days of treatment. Your symptoms get worse. You have trouble moving the injured area. Get help right away if: You have very bad pain. You have a loss of feeling (numbness) in a hand or foot. Your hand or foot turns pale or cold. This information is not intended to replace advice given to you by your health care provider. Make sure you discuss any questions you have with your health care provider. Document Revised: 06/03/2022 Document Reviewed: 06/03/2022 Elsevier Patient Education  2024 Elsevier Inc.  The above assessment and management plan was discussed with the patient. The patient verbalized understanding of and has agreed to the management plan. Patient is aware to call the clinic if they develop any new symptoms or if symptoms persist or worsen. Patient is aware when to return to the clinic for a follow-up visit. Patient educated on when it is appropriate to go to the emergency department.  Aleah Ahlgrim [place] Thompson, DNP [facility] [address]  [address] [place], [place] [address] [phone]

## 2024-09-21 NOTE — Telephone Encounter (Signed)
 Copied from CRM [id number]. Topic: Clinical - Lab/Test Results >> Sep 20, 2024  4:50 PM Harlene ORN wrote: Reason for CRM: Patient is calling to follow up on his Ultrasound results. Had Ultrasound done last Thursday at [facility]. Please call back the patient to discuss.

## 2024-09-21 NOTE — Telephone Encounter (Signed)
 Pt aware that we do not have the results. The site is improving but back of same leg has a cramping.   Offered to make an appt today and pt declined. He is going to check the leg and if he feels he needs to be seen he will call back to schedule.

## 2024-09-21 NOTE — Telephone Encounter (Signed)
 FYI Only or Action Required?: FYI only for provider.  Patient was last seen in primary care on 09/10/2024 by Dettinger, Tyler Wheeler LABOR, MD.  Called Nurse Triage reporting Leg Pain and Leg Swelling.  Symptoms began today.  Interventions attempted: OTC medications: Tylenol .  Symptoms are: gradually worsening.  Triage Disposition: See HCP Within 4 Hours (Or PCP Triage)  Patient/caregiver understands and will follow disposition?: Yes                             Copied from CRM #[id number]. Topic: Clinical - Red Word Triage >> Sep 21, 2024  9:24 AM Tyler Wheeler wrote: Red Word that prompted transfer to Nurse Triage: pain in back of his legs   ----------------------------------------------------------------------- From previous Reason for Contact - Scheduling: Patient/patient representative is calling to schedule an appointment. Refer to attachments for appointment information. Reason for Disposition  [1] Thigh, calf, or ankle swelling AND [2] only 1 side  Answer Assessment - Initial Assessment Questions 1. ONSET: When did the swelling start? (e.g., minutes, hours, days)     Today 2. LOCATION: What part of the leg is swollen?  Are both legs swollen or just one leg?     Left leg about 1 1/2 inches up from bend of knee 3. SEVERITY: How bad is the swelling? (e.g., localized; mild, moderate, severe)     Localized- estimates raised area to be length and width of big pink eraser 4. REDNESS: Is there redness or signs of infection?     Denies  5. PAIN: Is the swelling painful to touch? If Yes, ask: How painful is it?   (Scale 1-10; mild, moderate or severe)     Pain when walking, rates pain 3-4  6. FEVER: Do you have a fever? If Yes, ask: What is it, how was it measured, and when did it start?      Denies 7. CAUSE: What do you think is causing the leg swelling?     Unsure,  8. MEDICAL HISTORY: Do you have a history of blood clots (e.g., DVT), cancer,  heart failure, kidney disease, or liver failure?     Denies personal history of blood clots, but states he has a family history of blood clots 9. RECURRENT SYMPTOM: Have you had leg swelling before? If Yes, ask: When was the last time? What happened that time?     States he went to hospital last Thursday to have an ultrasound done on a separate area of swelling 10. OTHER SYMPTOMS: Do you have any other symptoms? (e.g., chest pain, difficulty breathing)     Slight purple discoloration of localized swelling, denies chest pain, denies difficulty breathing, denies numbness/tingling  Protocols used: Leg Swelling and Edema-A-AH

## 2024-09-22 ENCOUNTER — Ambulatory Visit: Payer: Self-pay | Admitting: Family Medicine

## 2024-12-05 ENCOUNTER — Other Ambulatory Visit: Payer: Self-pay | Admitting: Family Medicine

## 2024-12-05 DIAGNOSIS — R600 Localized edema: Secondary | ICD-10-CM

## 2024-12-05 DIAGNOSIS — I83893 Varicose veins of bilateral lower extremities with other complications: Secondary | ICD-10-CM

## 2025-02-16 ENCOUNTER — Encounter: Payer: BC Managed Care – PPO | Admitting: Family Medicine

## 2025-04-22 ENCOUNTER — Encounter: Admitting: Family Medicine
